# Patient Record
Sex: Male | Born: 1950 | Race: Black or African American | Hispanic: No | Marital: Single | Smoking: Never smoker
Health system: Southern US, Community
[De-identification: ages and names within clinical notes are randomized; demographics above are authoritative.]

## PROBLEM LIST (undated history)

## (undated) DIAGNOSIS — I639 Cerebral infarction, unspecified: Secondary | ICD-10-CM

## (undated) DIAGNOSIS — I2510 Atherosclerotic heart disease of native coronary artery without angina pectoris: Secondary | ICD-10-CM

## (undated) DIAGNOSIS — N189 Chronic kidney disease, unspecified: Secondary | ICD-10-CM

## (undated) DIAGNOSIS — E669 Obesity, unspecified: Secondary | ICD-10-CM

## (undated) DIAGNOSIS — F329 Major depressive disorder, single episode, unspecified: Secondary | ICD-10-CM

## (undated) DIAGNOSIS — I509 Heart failure, unspecified: Secondary | ICD-10-CM

## (undated) DIAGNOSIS — N400 Enlarged prostate without lower urinary tract symptoms: Secondary | ICD-10-CM

## (undated) DIAGNOSIS — F32A Depression, unspecified: Secondary | ICD-10-CM

## (undated) DIAGNOSIS — E785 Hyperlipidemia, unspecified: Secondary | ICD-10-CM

## (undated) DIAGNOSIS — F419 Anxiety disorder, unspecified: Secondary | ICD-10-CM

## (undated) DIAGNOSIS — M48061 Spinal stenosis, lumbar region without neurogenic claudication: Secondary | ICD-10-CM

## (undated) DIAGNOSIS — G4730 Sleep apnea, unspecified: Secondary | ICD-10-CM

## (undated) DIAGNOSIS — I10 Essential (primary) hypertension: Secondary | ICD-10-CM

## (undated) HISTORY — PX: COLONOSCOPY: SHX174

## (undated) HISTORY — PX: CARDIAC CATHETERIZATION: SHX172

## (undated) HISTORY — PX: THROMBECTOMY: PRO61

## (undated) HISTORY — PX: HEMORRHOID SURGERY: SHX153

## (undated) HISTORY — PX: CHOLECYSTECTOMY: SHX55

## (undated) HISTORY — PX: JOINT REPLACEMENT: SHX530

## (undated) HISTORY — PX: ABOVE KNEE LEG AMPUTATION: SUR20

## (undated) HISTORY — PX: SHOULDER ARTHROSCOPY: SHX128

## (undated) HISTORY — PX: AV FISTULA PLACEMENT: SHX1204

---

## 2016-08-26 ENCOUNTER — Inpatient Hospital Stay
Admission: RE | Admit: 2016-08-26 | Discharge: 2016-10-05 | Disposition: A | Discharge status: Skilled Nursing Facility | Payer: Medicare Other | Source: Other Acute Inpatient Hospital | Attending: Internal Medicine | Admitting: Internal Medicine

## 2016-08-26 ENCOUNTER — Other Ambulatory Visit (HOSPITAL_COMMUNITY): Payer: Medicare Other

## 2016-08-26 DIAGNOSIS — T8241XS Breakdown (mechanical) of vascular dialysis catheter, sequela: Secondary | ICD-10-CM

## 2016-08-26 DIAGNOSIS — W19XXXA Unspecified fall, initial encounter: Secondary | ICD-10-CM

## 2016-08-26 DIAGNOSIS — I770 Arteriovenous fistula, acquired: Secondary | ICD-10-CM

## 2016-08-26 DIAGNOSIS — Z452 Encounter for adjustment and management of vascular access device: Secondary | ICD-10-CM

## 2016-08-26 DIAGNOSIS — J189 Pneumonia, unspecified organism: Secondary | ICD-10-CM

## 2016-08-26 DIAGNOSIS — J9690 Respiratory failure, unspecified, unspecified whether with hypoxia or hypercapnia: Secondary | ICD-10-CM

## 2016-08-26 DIAGNOSIS — R7881 Bacteremia: Secondary | ICD-10-CM

## 2016-08-26 DIAGNOSIS — M549 Dorsalgia, unspecified: Secondary | ICD-10-CM

## 2016-08-26 HISTORY — DX: Heart failure, unspecified: I50.9

## 2016-08-26 HISTORY — DX: Spinal stenosis, lumbar region without neurogenic claudication: M48.061

## 2016-08-26 HISTORY — DX: Major depressive disorder, single episode, unspecified: F32.9

## 2016-08-26 HISTORY — DX: Atherosclerotic heart disease of native coronary artery without angina pectoris: I25.10

## 2016-08-26 HISTORY — DX: Depression, unspecified: F32.A

## 2016-08-26 HISTORY — DX: Sleep apnea, unspecified: G47.30

## 2016-08-26 HISTORY — DX: Essential (primary) hypertension: I10

## 2016-08-26 HISTORY — DX: Hyperlipidemia, unspecified: E78.5

## 2016-08-26 HISTORY — DX: Chronic kidney disease, unspecified: N18.9

## 2016-08-26 HISTORY — DX: Benign prostatic hyperplasia without lower urinary tract symptoms: N40.0

## 2016-08-26 HISTORY — DX: Anxiety disorder, unspecified: F41.9

## 2016-08-26 HISTORY — DX: Obesity, unspecified: E66.9

## 2016-08-26 HISTORY — DX: Cerebral infarction, unspecified: I63.9

## 2016-08-27 LAB — CBC WITH DIFFERENTIAL/PLATELET
BASOS PCT: 1 %
Basophils Absolute: 0.1 10*3/uL (ref 0.0–0.1)
Eosinophils Absolute: 0.4 10*3/uL (ref 0.0–0.7)
Eosinophils Relative: 5 %
HEMATOCRIT: 30.5 % — AB (ref 39.0–52.0)
HEMOGLOBIN: 9 g/dL — AB (ref 13.0–17.0)
LYMPHS ABS: 2.5 10*3/uL (ref 0.7–4.0)
Lymphocytes Relative: 30 %
MCH: 30.8 pg (ref 26.0–34.0)
MCHC: 29.5 g/dL — AB (ref 30.0–36.0)
MCV: 104.5 fL — ABNORMAL HIGH (ref 78.0–100.0)
MONOS PCT: 10 %
Monocytes Absolute: 0.8 10*3/uL (ref 0.1–1.0)
NEUTROS ABS: 4.4 10*3/uL (ref 1.7–7.7)
NEUTROS PCT: 54 %
Platelets: 226 10*3/uL (ref 150–400)
RBC: 2.92 MIL/uL — AB (ref 4.22–5.81)
RDW: 18.1 % — ABNORMAL HIGH (ref 11.5–15.5)
WBC: 8.2 10*3/uL (ref 4.0–10.5)

## 2016-08-27 LAB — COMPREHENSIVE METABOLIC PANEL
ALT: 11 U/L — AB (ref 17–63)
AST: 37 U/L (ref 15–41)
Albumin: 2.6 g/dL — ABNORMAL LOW (ref 3.5–5.0)
Alkaline Phosphatase: 131 U/L — ABNORMAL HIGH (ref 38–126)
Anion gap: 9 (ref 5–15)
BILIRUBIN TOTAL: 0.5 mg/dL (ref 0.3–1.2)
BUN: 17 mg/dL (ref 6–20)
CALCIUM: 8.3 mg/dL — AB (ref 8.9–10.3)
CO2: 30 mmol/L (ref 22–32)
CREATININE: 6.92 mg/dL — AB (ref 0.61–1.24)
Chloride: 97 mmol/L — ABNORMAL LOW (ref 101–111)
GFR calc Af Amer: 9 mL/min — ABNORMAL LOW (ref >60)
GFR, EST NON AFRICAN AMERICAN: 7 mL/min — AB (ref >60)
Glucose, Bld: 92 mg/dL (ref 65–99)
Potassium: 3.7 mmol/L (ref 3.5–5.1)
Sodium: 136 mmol/L (ref 135–145)
TOTAL PROTEIN: 7.4 g/dL (ref 6.5–8.1)

## 2016-08-27 LAB — LIPID PANEL
Cholesterol: 68 mg/dL (ref 0–200)
HDL: 28 mg/dL — ABNORMAL LOW (ref >40)
LDL CALC: 27 mg/dL (ref 0–99)
Total CHOL/HDL Ratio: 2.4 RATIO
Triglycerides: 63 mg/dL (ref <150)
VLDL: 13 mg/dL (ref 0–40)

## 2016-08-27 LAB — PROTIME-INR
INR: 1.16
Prothrombin Time: 14.9 seconds (ref 11.4–15.2)

## 2016-08-27 LAB — T4, FREE: Free T4: 1.06 ng/dL (ref 0.61–1.12)

## 2016-08-27 LAB — MAGNESIUM: MAGNESIUM: 2.1 mg/dL (ref 1.7–2.4)

## 2016-08-27 LAB — PHOSPHORUS: Phosphorus: 2.2 mg/dL — ABNORMAL LOW (ref 2.5–4.6)

## 2016-08-27 NOTE — Consult Note (Signed)
[facility] CONSULT NOTE    Date: 08/27/2016                  Patient Name:  Christopher Walls  MRN: [id number]  DOB: Jan 06, 1951  Age / Sex: 65 y.o., male         PCP: Vianne BullsSTUGART,RICKY H, MD                 Service Requesting Consult: Hospitalist                 Reason for Consult: Evaluation and management of ESRD            History of Present Illness: Patient is a 65 y.o. male with a PMHx of ESRD on dialysis, hypertension, coronary artery disease, previous CVA, severe peripheral vascular disease, bilateral above-the-knee amputations, who was admitted to [facility] on 08/26/2016 for ongoing management of a very severe large sacral decubitus ulcer.  The patient has complicated history.  His current illness began after he had recent right above-the-knee amputation.  Thereafter he went to a rehabilitation facility.  He then developed fever and sepsis and was transferred back to acute care facility.  He was initially treated with Zyvox and Zosyn.  Patient underwent diverting colostomy 07/26/16.  He also had debridement of his large check a decubitus ulcer on 08/09/16.  The big issue remains that the patient is unable to sit for hemodialysis given the size and severity of his underlying sacral decubitus ulcer.  His hemodialysis access recently clotted and he has a right internal jugular PermCath in place at the moment.   Medications at discharge from acute care facility:  ascorbic acid (VITAMIN C) 100 MG tablet Take one tablet (100 mg total) by mouth daily. Start date: 08/27/2016, End date: 08/27/2017   camphor-menthol [facility](SARNA) lotion Apply topically as needed for Itching. Start date: 08/26/2016, End date: 08/26/2017   clotrimazole-betamethasone (LOTRISONE) cream Apply topically every 12 (twelve) hours. Start date: 08/26/2016, End date: 08/26/2017   darbepoetin (ARANESP) 100 mcg/mL injection Inject 2 mLs (200 mcg total) into the skin every 7 (seven) days. Start date: 08/30/2016    melatonin 3 mg TABS Take one tablet (3 mg total) by mouth at bedtime. Start date: 08/26/2016   morphine sulfate (MSIR) 15 mg tablet Take one tablet (15 mg total) by mouth every 12 (twelve) hours as needed. Start date: 08/26/2016   sennosides-docusate sodium (SENOKOT-S) 8.6-50 mg per tablet Take two tablets by mouth 2 (two) times daily. Start date: 08/26/2016, End date: 08/26/2017   sodium hypochlorite (DAKIN'S) 0.025% SOLN Apply 15 mLs topically every 12 (twelve) hours. Start date: 08/26/2016    CONTINUED medications  Details  acetaminophen (TYLENOL) 325 mg tablet Take 650 mg by mouth 3 (three) times a day as needed for Pain. Takes two tablets (650mg )   aspirin 81 mg chewable tablet Chew 81 mg by mouth every morning.   B Complex-C-Folic Acid (DIALYVITE PO) Take 1 tablet by mouth every morning.   cinacalcet (SENSIPAR) 60 mg tablet TAKE 1 TABLET BY MOUTH DAILY   escitalopram oxalate (LEXAPRO) 10 mg tablet take 1 tablet by mouth once daily   lanthanum carbonate (FOSRENOL) 1000 mg chewable tablet Chew 2,000 mg by mouth 3 (three) times daily with meals. Taking 2 tabs po w/meals    polyethylene glycol (MIRALAX) packet Take 17 g by mouth daily as needed.   Respiratory Therapy Supplies DEVI CPAP at bedtime   simvastatin (ZOCOR) 20 MG tablet Take 10 mg by mouth at  bedtime. Takes one half tablet (10mg )   busPIRone (BUSPAR) 5 mg tablet take 1 and 1/2 tablets by mouth three times a day          Allergies: Allergies not on file    Past Medical/Surigcal History: . *Severe sepsis (*) 07/15/2016 Yes  . Depression, unspecified depression type 08/24/2016 Yes  . Anxiety disorder, unspecified type 08/24/2016 Yes  . Protein-calorie malnutrition, severe (*) 08/22/2016 Unknown  . Decubitus ulcer of buttock, stage 4 (*) 08/17/2016 Yes  . Lip lesion 08/16/2016 Yes  . Altered mental state 07/29/2016 Yes  . S/P colostomy (*) 07/26/2016 Not Applicable  . Delirium due to another medical  condition, acute, mixed level of activity 07/03/2016 Yes  . Fever 07/03/2016 Yes  . S/P AKA (above knee amputation) unilateral, right (*) 07/02/2016 Not Applicable  . S/P AKA (above knee amputation) unilateral, left (*) 06/18/2014 Not Applicable  . PAD (peripheral artery disease) (*) 03/04/2014 Yes  . Anemia of chronic renal failure 07/20/2013 Yes  . History of stroke 05/18/2013 Not Applicable  . Mixed hyperlipidemia 05/18/2013 Yes  . End-stage renal disease on hemodialysis (*) 04/26/2012 Not Applicable  . Essential hypertension 04/26/2012 Yes  . Obesity, Class III, BMI 40-49.9 (morbid obesity) (*) 04/26/2012 Yes  . S/P coronary artery stent placement 04/26/2012 Not Applicable  . History of coronary artery disease 07/27/2011 Not Applicable  . Obstructive sleep apnea   Family History: No family history on file.   Social History: Social History   Social History  . Marital status: Single    Spouse name: N/A  . Number of children: N/A  . Years of education: N/A   Occupational History  . Not on file.   Social History Main Topics  . Smoking status: Not on file  . Smokeless tobacco: Not on file  . Alcohol use Not on file  . Drug use: Unknown  . Sexual activity: Not on file   Other Topics Concern  . Not on file   Social History Narrative  . No narrative on file     Review of Systems: Review of Systems  Constitutional: Positive for malaise/fatigue. Negative for chills and fever.  HENT: Negative for hearing loss and tinnitus.   Eyes: Negative for blurred vision, double vision and photophobia.  Respiratory: Negative for cough, hemoptysis and sputum production.   Cardiovascular: Negative for chest pain, palpitations and orthopnea.  Gastrointestinal: Negative for heartburn, nausea and vomiting.  Genitourinary: Negative for dysuria and urgency.  Musculoskeletal: Positive for joint pain.  Skin: Negative for rash.  Neurological: Negative for dizziness, seizures and  headaches.  Endo/Heme/Allergies: Negative for polydipsia. Does not bruise/bleed easily.  Psychiatric/Behavioral: Positive for depression. The patient is not nervous/anxious.      Vital Signs: Pulse 75 respirations 21 and blood pressure 88/48.  Pulse ox 100% Physical Exam: General: NAD, laying on his left side  Head: Normocephalic, atraumatic.  Eyes: Anicteric, EOMI  Nose: Mucous membranes moist, not inflammed, nonerythematous.  Throat: Oropharynx nonerythematous, no exudate appreciated.   Neck: Supple, trachea midline.  Lungs:  Normal respiratory effort. Clear to auscultation BL without crackles or wheezes.  Heart: RRR. S1 and S2 normal without gallop, murmur, or rubs.  Abdomen:  Diverting colostomy in place  Extremities: Bilateral above the knee amputations  Neurologic: A&O X3, follows commands with both upper extremeties  Skin: No visible rashes, scars.    Lab results: Basic Metabolic Panel:  Recent Labs Lab 08/27/16 0424  NA 136  K 3.7  CL 97*  CO2  30  GLUCOSE 92  BUN 17  CREATININE 6.92*  CALCIUM 8.3*  MG 2.1  PHOS 2.2*    Liver Function Tests:  Recent Labs Lab 08/27/16 0424  AST 37  ALT 11*  ALKPHOS 131*  BILITOT 0.5  PROT 7.4  ALBUMIN 2.6*   No results for input(s): LIPASE, AMYLASE in the last 168 hours. No results for input(s): AMMONIA in the last 168 hours.  CBC:  Recent Labs Lab 08/27/16 0424  WBC 8.2  NEUTROABS 4.4  HGB 9.0*  HCT 30.5*  MCV 104.5*  PLT 226    Cardiac Enzymes: No results for input(s): CKTOTAL, CKMB, CKMBINDEX, TROPONINI in the last 168 hours.  BNP: Invalid input(s): POCBNP  CBG: No results for input(s): GLUCAP in the last 168 hours.  Microbiology: No results found for this or any previous visit.  Coagulation Studies:  Recent Labs  08/27/16 0424  LABPROT 14.9  INR 1.16    Urinalysis: No results for input(s): COLORURINE, LABSPEC, PHURINE, GLUCOSEU, HGBUR, BILIRUBINUR, KETONESUR, PROTEINUR, UROBILINOGEN,  NITRITE, LEUKOCYTESUR in the last 72 hours.  Invalid input(s): APPERANCEUR    Imaging: Dg Chest Port 1 View  Result Date: 08/26/2016 CLINICAL DATA:  Acute onset of respiratory failure. Initial encounter. EXAM: PORTABLE CHEST 1 VIEW COMPARISON:  None. FINDINGS: The lungs are well-aerated. Mild vascular congestion is noted. Mild right basilar opacity may reflect atelectasis or possibly mild pneumonia. There is no evidence of pleural effusion or pneumothorax. The cardiomediastinal silhouette is within normal limits. No acute osseous abnormalities are seen. A right-sided dual-lumen catheter is noted ending about the mid SVC. IMPRESSION: Mild vascular congestion noted. Mild right basilar opacity may reflect atelectasis or possibly mild pneumonia. Electronically Signed   By: Roanna RaiderJeffery  Chang M.D.   On: 08/26/2016 22:13      Assessment & Plan: Pt is a 65 y.o. male  with a PMHx of ESRD on dialysis, hypertension, coronary artery disease, previous CVA, severe peripheral vascular disease, bilateral above-the-knee amputations, who was admitted to [facility] on 08/26/2016 for ongoing management of a very severe large sacral decubitus ulcer.   1.  ESRD on HD:  Patient was previously on dialysis Monday, Wednesday, Friday.  We will accommodate the patient on dialysis on Tuesday, Thursday, Saturday after discussion with dialysis charge nurse.  The big issue will be how the patient will be able to sit up for dialysis.  He has an extremely large sacral decubitus ulcer.  Currently unclear as to how much to healing will occur.  Patient will need to be able to tolerate sitting up for at least 4 hours for the dialysis as well as associated transportation.  For now we will continue to perform dialysis in the bed.  The patient will have a very difficult disposition.   to.  Anemia chronic kidney disease.  Hemoglobin currently 9.0.  Patient was on Aranesp prior to his hospitalization here.  He was receiving 100 g subcutaneous  weekly.  We will resume Aranesp 100 g subcutaneous weekly while here.  3.  Secondary hyperparathyroidism.  Check intact PTH and phosphorus with next I also is treatment.  Continue Fosrenol as well as Sensipar.  4. Large sacral decubitus ulcer.  As per wound care team as well as hospitalist.  His disposition is in question as above.  5.  Hypotension:  Apparently chronic in nature, hospitalist starting midodrine today.    6.  Thanks for consultation.

## 2016-08-28 LAB — RENAL FUNCTION PANEL
ALBUMIN: 2.1 g/dL — AB (ref 3.5–5.0)
Anion gap: 8 (ref 5–15)
BUN: 26 mg/dL — AB (ref 6–20)
CALCIUM: 8 mg/dL — AB (ref 8.9–10.3)
CO2: 31 mmol/L (ref 22–32)
CREATININE: 8.81 mg/dL — AB (ref 0.61–1.24)
Chloride: 98 mmol/L — ABNORMAL LOW (ref 101–111)
GFR calc Af Amer: 6 mL/min — ABNORMAL LOW (ref >60)
GFR calc non Af Amer: 6 mL/min — ABNORMAL LOW (ref >60)
GLUCOSE: 81 mg/dL (ref 65–99)
PHOSPHORUS: 2.9 mg/dL (ref 2.5–4.6)
POTASSIUM: 4 mmol/L (ref 3.5–5.1)
SODIUM: 137 mmol/L (ref 135–145)

## 2016-08-28 LAB — CBC
HCT: 29.9 % — ABNORMAL LOW (ref 39.0–52.0)
Hemoglobin: 8.9 g/dL — ABNORMAL LOW (ref 13.0–17.0)
MCH: 31.1 pg (ref 26.0–34.0)
MCHC: 29.8 g/dL — AB (ref 30.0–36.0)
MCV: 104.5 fL — ABNORMAL HIGH (ref 78.0–100.0)
PLATELETS: 217 10*3/uL (ref 150–400)
RBC: 2.86 MIL/uL — ABNORMAL LOW (ref 4.22–5.81)
RDW: 17.9 % — AB (ref 11.5–15.5)
WBC: 9.5 10*3/uL (ref 4.0–10.5)

## 2016-08-29 LAB — HEMOGLOBIN A1C
HEMOGLOBIN A1C: 4.4 % — AB (ref 4.8–5.6)
Mean Plasma Glucose: 80 mg/dL

## 2016-08-29 LAB — HEPATITIS B SURFACE ANTIGEN: Hepatitis B Surface Ag: NEGATIVE

## 2016-08-29 LAB — HEPATITIS B SURFACE ANTIBODY,QUALITATIVE: Hep B S Ab: NONREACTIVE

## 2016-08-29 LAB — HEPATITIS B CORE ANTIBODY, IGM: Hep B C IgM: NEGATIVE

## 2016-08-30 NOTE — Progress Notes (Signed)
Subjective:   Patient's wife is at bedside. He is noted to provide much details. She states that he is eating poorly. He underwent dialysis on Saturday. Tolerated well.  Objective:  Vital signs in last 24 hours:  BP: ()/()  Arterial Line BP: ()/()   Weight change:  There were no vitals filed for this visit.  Intake/Output:   No intake or output data in the 24 hours ending 08/30/16 1610 Vital signs: Temperature 98.7, pulse 73, respirations 14, blood pressure 99/51  Physical Exam: General: No acute distress, laying in the bed, obese  HEENT Anicteric, moist mucous membranes  Neck supple  Pulm/lungs  normal breathing effort, clear to auscultation  CVS/Heart No rub or gallop  Abdomen:  Soft, nontender, nondistended, diverting colostomy  Extremities: Bilateral above-the-knee amputation, right wound healing ongoing  Neurologic: Alert, appears depressed, able to follow simple commands  Skin: Wound VAC to the sacral decubitus ulcer  Access: Right IJ PermCath       Basic Metabolic Panel:   Recent Labs Lab 08/27/16 0424 08/28/16 0745  NA 136 137  K 3.7 4.0  CL 97* 98*  CO2 30 31  GLUCOSE 92 81  BUN 17 26*  CREATININE 6.92* 8.81*  CALCIUM 8.3* 8.0*  MG 2.1  --   PHOS 2.2* 2.9     CBC:  Recent Labs Lab 08/27/16 0424 08/28/16 0745  WBC 8.2 9.5  NEUTROABS 4.4  --   HGB 9.0* 8.9*  HCT 30.5* 29.9*  MCV 104.5* 104.5*  PLT 226 217      Microbiology:  Recent Results (from the past 720 hour(s))  Culture, blood (routine x 2)     Status: None (Preliminary result)   Collection Time: 08/26/16  9:45 PM  Result Value Ref Range Status   Specimen Description BLOOD RIGHT ANTECUBITAL  Final   Special Requests BOTTLES DRAWN AEROBIC AND ANAEROBIC 5CC  Final   Culture NO GROWTH 4 DAYS  Final   Report Status PENDING  Incomplete  Culture, blood (routine x 2)     Status: None (Preliminary result)   Collection Time: 08/26/16  9:52 PM  Result Value Ref Range Status    Specimen Description BLOOD RIGHT HAND  Final   Special Requests IN PEDIATRIC BOTTLE 3CC  Final   Culture NO GROWTH 4 DAYS  Final   Report Status PENDING  Incomplete    Coagulation Studies: No results for input(s): LABPROT, INR in the last 72 hours.  Urinalysis: No results for input(s): COLORURINE, LABSPEC, PHURINE, GLUCOSEU, HGBUR, BILIRUBINUR, KETONESUR, PROTEINUR, UROBILINOGEN, NITRITE, LEUKOCYTESUR in the last 72 hours.  Invalid input(s): APPERANCEUR    Imaging: No results found.   Medications:       Assessment/ Plan:  65 y.o. male  with a PMHx of ESRD on dialysis for 9 years, hypertension, coronary artery disease, previous CVA, severe peripheral vascular disease, bilateral above-the-knee amputations, who was admitted to [facility] on 08/26/2016 for ongoing management of a very severe large sacral decubitus ulcer.   1.  ESRD on HD:  Patient was previously on dialysis Monday, Wednesday, Friday.   For now, patient was dialyzed on Tuesday, Thursday, Saturday shift. We will switch him to MWF as a slot becomes available.    The big issue will be how the patient will be able to sit up for dialysis.  He has an extremely large sacral decubitus ulcer.  Currently unclear as to how much to healing will occur.  Patient will need to be able to tolerate sitting up  for at least 4 hours for the dialysis as well as associated transportation.  For now we will continue to perform dialysis in the bed.  The patient will have a very difficult disposition.  2.  Anemia chronic kidney disease.  Hemoglobin currently 8.9.  Patient was on Aranesp prior to his hospitalization here.  He was receiving 100 g subcutaneous weekly.  We will resume Aranesp 100 g subcutaneous weekly while here.  3.  Secondary hyperparathyroidism.  Monitor phosphorus.  Continue Fosrenol as well as Sensipar.  4. Large sacral decubitus ulcer.  As per wound care team as well as hospitalist.  His disposition is in question as  above.  5.  Hypotension:   starting midodrine today.        LOS: 0 Christopher Walls 11/6/20174:10 PM

## 2016-08-31 LAB — RENAL FUNCTION PANEL
ALBUMIN: 2.2 g/dL — AB (ref 3.5–5.0)
ANION GAP: 10 (ref 5–15)
BUN: 49 mg/dL — AB (ref 6–20)
CALCIUM: 8.1 mg/dL — AB (ref 8.9–10.3)
CO2: 28 mmol/L (ref 22–32)
Chloride: 95 mmol/L — ABNORMAL LOW (ref 101–111)
Creatinine, Ser: 11.05 mg/dL — ABNORMAL HIGH (ref 0.61–1.24)
GFR calc Af Amer: 5 mL/min — ABNORMAL LOW (ref >60)
GFR calc non Af Amer: 4 mL/min — ABNORMAL LOW (ref >60)
GLUCOSE: 96 mg/dL (ref 65–99)
PHOSPHORUS: 3.2 mg/dL (ref 2.5–4.6)
Potassium: 4.1 mmol/L (ref 3.5–5.1)
SODIUM: 133 mmol/L — AB (ref 135–145)

## 2016-08-31 LAB — CBC
HCT: 29.2 % — ABNORMAL LOW (ref 39.0–52.0)
HEMOGLOBIN: 9 g/dL — AB (ref 13.0–17.0)
MCH: 31.5 pg (ref 26.0–34.0)
MCHC: 30.8 g/dL (ref 30.0–36.0)
MCV: 102.1 fL — ABNORMAL HIGH (ref 78.0–100.0)
Platelets: 235 10*3/uL (ref 150–400)
RBC: 2.86 MIL/uL — ABNORMAL LOW (ref 4.22–5.81)
RDW: 17.5 % — AB (ref 11.5–15.5)
WBC: 11.2 10*3/uL — ABNORMAL HIGH (ref 4.0–10.5)

## 2016-08-31 LAB — CULTURE, BLOOD (ROUTINE X 2)
CULTURE: NO GROWTH
Culture: NO GROWTH

## 2016-09-01 NOTE — Progress Notes (Signed)
Subjective:   Patient's wife is at bedside. He is doing fair. Underwent wound vac change therefore pain at the moment. Tolerated dialysis well on Tuesday  Objective:  Vital signs in last 24 hours:  BP: ()/()  Arterial Line BP: ()/()   Weight change:  There were no vitals filed for this visit.  Intake/Output:   No intake or output data in the 24 hours ending 09/01/16 1612 Vital signs: Temperature 97.9, pulse 65, respirations 25, blood pressure 120/50  Physical Exam: General: No acute distress, laying in the bed, obese  HEENT Anicteric, moist mucous membranes  Neck supple  Pulm/lungs  normal breathing effort, clear to auscultation  CVS/Heart No rub or gallop  Abdomen:  Soft, nontender, nondistended, diverting colostomy  Extremities: Bilateral above-the-knee amputation, right wound healing ongoing  Neurologic: Alert, appears depressed, able to follow simple commands  Skin: Wound VAC to the sacral decubitus ulcer  Access: Right IJ PermCath       Basic Metabolic Panel:   Recent Labs Lab 08/27/16 0424 08/28/16 0745 08/31/16 0749  NA 136 137 133*  K 3.7 4.0 4.1  CL 97* 98* 95*  CO2 30 31 28   GLUCOSE 92 81 96  BUN 17 26* 49*  CREATININE 6.92* 8.81* 11.05*  CALCIUM 8.3* 8.0* 8.1*  MG 2.1  --   --   PHOS 2.2* 2.9 3.2     CBC:  Recent Labs Lab 08/27/16 0424 08/28/16 0745 08/31/16 0749  WBC 8.2 9.5 11.2*  NEUTROABS 4.4  --   --   HGB 9.0* 8.9* 9.0*  HCT 30.5* 29.9* 29.2*  MCV 104.5* 104.5* 102.1*  PLT 226 217 235      Microbiology:  Recent Results (from the past 720 hour(s))  Culture, blood (routine x 2)     Status: None   Collection Time: 08/26/16  9:45 PM  Result Value Ref Range Status   Specimen Description BLOOD RIGHT ANTECUBITAL  Final   Special Requests BOTTLES DRAWN AEROBIC AND ANAEROBIC 5CC  Final   Culture NO GROWTH 5 DAYS  Final   Report Status 08/31/2016 FINAL  Final  Culture, blood (routine x 2)     Status: None   Collection Time:  08/26/16  9:52 PM  Result Value Ref Range Status   Specimen Description BLOOD RIGHT HAND  Final   Special Requests IN PEDIATRIC BOTTLE 3CC  Final   Culture NO GROWTH 5 DAYS  Final   Report Status 08/31/2016 FINAL  Final    Coagulation Studies: No results for input(s): LABPROT, INR in the last 72 hours.  Urinalysis: No results for input(s): COLORURINE, LABSPEC, PHURINE, GLUCOSEU, HGBUR, BILIRUBINUR, KETONESUR, PROTEINUR, UROBILINOGEN, NITRITE, LEUKOCYTESUR in the last 72 hours.  Invalid input(s): APPERANCEUR    Imaging: No results found.   Medications:       Assessment/ Plan:  65 y.o. male  with a PMHx of ESRD on dialysis for 9 years, hypertension, coronary artery disease, previous CVA, severe peripheral vascular disease, bilateral above-the-knee amputations, who was admitted to [facility] on 08/26/2016 for ongoing management of a very severe large sacral decubitus ulcer.   1.  ESRD on HD:  Patient was previously on dialysis Monday, Wednesday, Friday.   Patient prefers to stay on TTS shift due to wound vac change schedules etc Next HD on Thursday   The big issue will be how the patient will be able to sit up for dialysis.  He has an extremely large sacral decubitus ulcer.  Currently unclear as to how much  to healing will occur.  Patient will need to be able to tolerate sitting up for at least 4 hours for the dialysis as well as associated transportation.  For now we will continue to perform dialysis in the bed.  The patient will have a very difficult disposition.  2.  Anemia chronic kidney disease.  Hemoglobin currently 9.0.  Patient was on Aranesp prior to his hospitalization here.  He was receiving 100 g subcutaneous weekly.  We will resume Aranesp 100 g subcutaneous weekly while here.  3.  Secondary hyperparathyroidism.  Monitor phosphorus.  Continue Fosrenol as well as Sensipar. Currently 3.2  4. Large sacral decubitus ulcer.  As per wound care team as well as  hospitalist.  His disposition is in question as above.  5.  Hypotension:  continue midodrine       LOS: 0 Kiala Faraj 11/8/20174:12 PM

## 2016-09-02 ENCOUNTER — Other Ambulatory Visit (HOSPITAL_COMMUNITY): Payer: Medicare Other

## 2016-09-02 LAB — RENAL FUNCTION PANEL
ALBUMIN: 2.8 g/dL — AB (ref 3.5–5.0)
Anion gap: 12 (ref 5–15)
BUN: 46 mg/dL — AB (ref 6–20)
CO2: 24 mmol/L (ref 22–32)
CREATININE: 11.38 mg/dL — AB (ref 0.61–1.24)
Calcium: 8.5 mg/dL — ABNORMAL LOW (ref 8.9–10.3)
Chloride: 96 mmol/L — ABNORMAL LOW (ref 101–111)
GFR calc Af Amer: 5 mL/min — ABNORMAL LOW (ref >60)
GFR calc non Af Amer: 4 mL/min — ABNORMAL LOW (ref >60)
GLUCOSE: 89 mg/dL (ref 65–99)
PHOSPHORUS: 3.9 mg/dL (ref 2.5–4.6)
POTASSIUM: 4.6 mmol/L (ref 3.5–5.1)
SODIUM: 132 mmol/L — AB (ref 135–145)

## 2016-09-02 LAB — CBC
HEMATOCRIT: 37.1 % — AB (ref 39.0–52.0)
Hemoglobin: 11.3 g/dL — ABNORMAL LOW (ref 13.0–17.0)
MCH: 31.3 pg (ref 26.0–34.0)
MCHC: 30.5 g/dL (ref 30.0–36.0)
MCV: 102.8 fL — ABNORMAL HIGH (ref 78.0–100.0)
PLATELETS: 278 10*3/uL (ref 150–400)
RBC: 3.61 MIL/uL — ABNORMAL LOW (ref 4.22–5.81)
RDW: 17.2 % — AB (ref 11.5–15.5)
WBC: 13.1 10*3/uL — ABNORMAL HIGH (ref 4.0–10.5)

## 2016-09-03 NOTE — Progress Notes (Signed)
Subjective:   Patient's wife is at bedside. He is doing fair. Underwent wound vac change therefore pain at the moment. Tolerated dialysis well on Thursday He fell down off of the bed after dialysis Currently no SOB  Objective:  Vital signs in last 24 hours:  Temperature 97.4, pulse 85, respirations 20, blood pressure 127/67  Physical Exam: General: No acute distress, laying in the bed, obese  HEENT Anicteric, moist mucous membranes  Neck supple  Pulm/lungs  normal breathing effort, clear to auscultation  CVS/Heart No rub or gallop  Abdomen:  Soft, nontender, nondistended, diverting colostomy  Extremities: Bilateral above-the-knee amputation, right wound healing ongoing  Neurologic: Alert, appears depressed, able to follow simple commands  Skin: Wound VAC to the sacral decubitus ulcer  Access: Right IJ PermCath       Basic Metabolic Panel:   Recent Labs Lab 08/28/16 0745 08/31/16 0749 09/02/16 0547  NA 137 133* 132*  K 4.0 4.1 4.6  CL 98* 95* 96*  CO2 31 28 24   GLUCOSE 81 96 89  BUN 26* 49* 46*  CREATININE 8.81* 11.05* 11.38*  CALCIUM 8.0* 8.1* 8.5*  PHOS 2.9 3.2 3.9     CBC:  Recent Labs Lab 08/28/16 0745 08/31/16 0749 09/02/16 0547  WBC 9.5 11.2* 13.1*  HGB 8.9* 9.0* 11.3*  HCT 29.9* 29.2* 37.1*  MCV 104.5* 102.1* 102.8*  PLT 217 235 278      Microbiology:  Recent Results (from the past 720 hour(s))  Culture, blood (routine x 2)     Status: None   Collection Time: 08/26/16  9:45 PM  Result Value Ref Range Status   Specimen Description BLOOD RIGHT ANTECUBITAL  Final   Special Requests BOTTLES DRAWN AEROBIC AND ANAEROBIC 5CC  Final   Culture NO GROWTH 5 DAYS  Final   Report Status 08/31/2016 FINAL  Final  Culture, blood (routine x 2)     Status: None   Collection Time: 08/26/16  9:52 PM  Result Value Ref Range Status   Specimen Description BLOOD RIGHT HAND  Final   Special Requests IN PEDIATRIC BOTTLE 3CC  Final   Culture NO GROWTH 5 DAYS   Final   Report Status 08/31/2016 FINAL  Final    Coagulation Studies: No results for input(s): LABPROT, INR in the last 72 hours.  Urinalysis: No results for input(s): COLORURINE, LABSPEC, PHURINE, GLUCOSEU, HGBUR, BILIRUBINUR, KETONESUR, PROTEINUR, UROBILINOGEN, NITRITE, LEUKOCYTESUR in the last 72 hours.  Invalid input(s): APPERANCEUR    Imaging: Ct Head Wo Contrast  Result Date: 09/02/2016 CLINICAL DATA:  Status post fall EXAM: CT HEAD WITHOUT CONTRAST TECHNIQUE: Contiguous axial images were obtained from the base of the skull through the vertex without intravenous contrast. COMPARISON:  None. FINDINGS: Brain: No mass lesion, intraparenchymal hemorrhage or extra-axial collection. No evidence of acute cortical infarct. Brain parenchyma and CSF-containing spaces are normal for age. Vascular: There is atherosclerotic calcification of the vertebral and internal carotid arteries at the skullbase. Skull: Normal visualized skull base, calvarium and extracranial soft tissues. Sinuses/Orbits: No sinus fluid levels or advanced mucosal thickening. No mastoid effusion. Normal orbits. IMPRESSION: No acute intracranial abnormality. Electronically Signed   By: Deatra RobinsonKevin  Herman M.D.   On: 09/02/2016 22:34     Medications:       Assessment/ Plan:  65 y.o. male  with a PMHx of ESRD on dialysis for 9 years, hypertension, coronary artery disease, previous CVA, severe peripheral vascular disease, bilateral above-the-knee amputations, who was admitted to [facility] on 08/26/2016 for ongoing management  of a very severe large sacral decubitus ulcer.   1.  ESRD on HD:  Patient was previously on dialysis Monday, Wednesday, Friday.   Patient prefers to stay on TTS shift due to wound vac change schedules etc Next HD on saturday   The big issue will be how the patient will be able to sit up for dialysis.  He has an extremely large sacral decubitus ulcer.  Currently unclear as to how much to healing will occur.   Patient will need to be able to tolerate sitting up for at least 4 hours for the dialysis as well as associated transportation.  For now we will continue to perform dialysis in the bed.  The patient will have a very difficult disposition.  2.  Anemia chronic kidney disease.  Hemoglobin currently 11.3.  Patient was on Aranesp prior to his hospitalization here.  He was receiving 100 g subcutaneous weekly.    Hgb increased. Will hold Aranesp  3.  Secondary hyperparathyroidism.  Monitor phosphorus.  Continue Fosrenol as well as Sensipar. Currently 39  4. Large sacral decubitus ulcer.  As per wound care team as well as hospitalist.  His disposition is in question as above.  5.  Hypotension:  continue midodrine       LOS: 0 Shiree Altemus 11/10/20176:56 PM

## 2016-09-04 LAB — BASIC METABOLIC PANEL
ANION GAP: 11 (ref 5–15)
BUN: 35 mg/dL — ABNORMAL HIGH (ref 6–20)
CALCIUM: 8.3 mg/dL — AB (ref 8.9–10.3)
CO2: 25 mmol/L (ref 22–32)
Chloride: 98 mmol/L — ABNORMAL LOW (ref 101–111)
Creatinine, Ser: 8.9 mg/dL — ABNORMAL HIGH (ref 0.61–1.24)
GFR calc Af Amer: 6 mL/min — ABNORMAL LOW (ref >60)
GFR calc non Af Amer: 5 mL/min — ABNORMAL LOW (ref >60)
GLUCOSE: 89 mg/dL (ref 65–99)
POTASSIUM: 4.8 mmol/L (ref 3.5–5.1)
Sodium: 134 mmol/L — ABNORMAL LOW (ref 135–145)

## 2016-09-04 LAB — CBC
HEMATOCRIT: 30.7 % — AB (ref 39.0–52.0)
Hemoglobin: 9.3 g/dL — ABNORMAL LOW (ref 13.0–17.0)
MCH: 31 pg (ref 26.0–34.0)
MCHC: 30.3 g/dL (ref 30.0–36.0)
MCV: 102.3 fL — AB (ref 78.0–100.0)
Platelets: 294 10*3/uL (ref 150–400)
RBC: 3 MIL/uL — ABNORMAL LOW (ref 4.22–5.81)
RDW: 16.9 % — AB (ref 11.5–15.5)
WBC: 16.3 10*3/uL — AB (ref 4.0–10.5)

## 2016-09-05 ENCOUNTER — Other Ambulatory Visit (HOSPITAL_COMMUNITY): Payer: Medicare Other

## 2016-09-05 LAB — BLOOD CULTURE ID PANEL (REFLEXED)
Acinetobacter baumannii: NOT DETECTED
CANDIDA KRUSEI: NOT DETECTED
CANDIDA PARAPSILOSIS: NOT DETECTED
CANDIDA TROPICALIS: NOT DETECTED
CARBAPENEM RESISTANCE: NOT DETECTED
Candida albicans: NOT DETECTED
Candida glabrata: NOT DETECTED
ENTEROCOCCUS SPECIES: NOT DETECTED
Enterobacter cloacae complex: NOT DETECTED
Enterobacteriaceae species: NOT DETECTED
Escherichia coli: NOT DETECTED
Haemophilus influenzae: NOT DETECTED
KLEBSIELLA OXYTOCA: NOT DETECTED
KLEBSIELLA PNEUMONIAE: NOT DETECTED
LISTERIA MONOCYTOGENES: NOT DETECTED
Methicillin resistance: DETECTED — AB
Neisseria meningitidis: NOT DETECTED
PROTEUS SPECIES: NOT DETECTED
PSEUDOMONAS AERUGINOSA: NOT DETECTED
SERRATIA MARCESCENS: NOT DETECTED
STAPHYLOCOCCUS SPECIES: DETECTED — AB
STREPTOCOCCUS AGALACTIAE: NOT DETECTED
Staphylococcus aureus (BCID): DETECTED — AB
Streptococcus pneumoniae: NOT DETECTED
Streptococcus pyogenes: NOT DETECTED
Streptococcus species: NOT DETECTED
VANCOMYCIN RESISTANCE: NOT DETECTED

## 2016-09-05 LAB — CBC WITH DIFFERENTIAL/PLATELET
BASOS PCT: 0 %
Basophils Absolute: 0 10*3/uL (ref 0.0–0.1)
EOS ABS: 0 10*3/uL (ref 0.0–0.7)
EOS PCT: 0 %
HCT: 32.3 % — ABNORMAL LOW (ref 39.0–52.0)
Hemoglobin: 9.7 g/dL — ABNORMAL LOW (ref 13.0–17.0)
LYMPHS ABS: 2.2 10*3/uL (ref 0.7–4.0)
Lymphocytes Relative: 8 %
MCH: 30.6 pg (ref 26.0–34.0)
MCHC: 30 g/dL (ref 30.0–36.0)
MCV: 101.9 fL — AB (ref 78.0–100.0)
MONO ABS: 2.8 10*3/uL — AB (ref 0.1–1.0)
Monocytes Relative: 10 %
Neutro Abs: 23.1 10*3/uL — ABNORMAL HIGH (ref 1.7–7.7)
Neutrophils Relative %: 82 %
PLATELETS: 262 10*3/uL (ref 150–400)
RBC: 3.17 MIL/uL — ABNORMAL LOW (ref 4.22–5.81)
RDW: 16.9 % — AB (ref 11.5–15.5)
WBC: 28.1 10*3/uL — ABNORMAL HIGH (ref 4.0–10.5)

## 2016-09-06 NOTE — Progress Notes (Signed)
Subjective:  Patient seen at bedside. He had hemodialysis on Tuesday. Still has a very large sacral decubitus ulcer.   Objective:  Vital signs in last 24 hours:  Temperature 98 pulse 82 respirations 20 blood pressure 133/60  Physical Exam: General: No acute distress, laying in the bed, obese  HEENT Anicteric, moist mucous membranes  Neck supple  Pulm/lungs  normal breathing effort, clear to auscultation  CVS/Heart No rub or gallop  Abdomen:  Soft, nontender, nondistended, diverting colostomy  Extremities: Bilateral above-the-knee amputation  Neurologic: Alert, able to follow simple commands  Skin: Wound VAC to the sacral decubitus ulcer  Access: Right IJ PermCath       Basic Metabolic Panel:   Recent Labs Lab 08/31/16 0749 09/02/16 0547 09/04/16 0620  NA 133* 132* 134*  K 4.1 4.6 4.8  CL 95* 96* 98*  CO2 28 24 25   GLUCOSE 96 89 89  BUN 49* 46* 35*  CREATININE 11.05* 11.38* 8.90*  CALCIUM 8.1* 8.5* 8.3*  PHOS 3.2 3.9  --      CBC:  Recent Labs Lab 08/31/16 0749 09/02/16 0547 09/04/16 0620 09/05/16 0617  WBC 11.2* 13.1* 16.3* 28.1*  NEUTROABS  --   --   --  23.1*  HGB 9.0* 11.3* 9.3* 9.7*  HCT 29.2* 37.1* 30.7* 32.3*  MCV 102.1* 102.8* 102.3* 101.9*  PLT 235 278 294 262      Microbiology:  Recent Results (from the past 720 hour(s))  Culture, blood (routine x 2)     Status: None   Collection Time: 08/26/16  9:45 PM  Result Value Ref Range Status   Specimen Description BLOOD RIGHT ANTECUBITAL  Final   Special Requests BOTTLES DRAWN AEROBIC AND ANAEROBIC 5CC  Final   Culture NO GROWTH 5 DAYS  Final   Report Status 08/31/2016 FINAL  Final  Culture, blood (routine x 2)     Status: None   Collection Time: 08/26/16  9:52 PM  Result Value Ref Range Status   Specimen Description BLOOD RIGHT HAND  Final   Special Requests IN PEDIATRIC BOTTLE 3CC  Final   Culture NO GROWTH 5 DAYS  Final   Report Status 08/31/2016 FINAL  Final  Culture, blood  (routine x 2)     Status: Abnormal (Preliminary result)   Collection Time: 09/04/16  2:15 PM  Result Value Ref Range Status   Specimen Description BLOOD RIGHT HAND  Final   Special Requests IN PEDIATRIC BOTTLE 5CC  Final   Culture  Setup Time   Final    IN PEDIATRIC BOTTLE GRAM POSITIVE COCCI IN CLUSTERS CRITICAL RESULT CALLED TO, READ BACK BY AND VERIFIED WITH: TO CMASEKO(RN) BY TCLEVELAND 09/05/2016 AT 6:28AM    Culture STAPHYLOCOCCUS AUREUS (A)  Final   Report Status PENDING  Incomplete  Culture, blood (routine x 2)     Status: Abnormal (Preliminary result)   Collection Time: 09/04/16  2:25 PM  Result Value Ref Range Status   Specimen Description BLOOD RIGHT HAND  Final   Special Requests IN PEDIATRIC BOTTLE 2CC  Final   Culture  Setup Time   Final    GRAM POSITIVE COCCI IN CLUSTERS IN PEDIATRIC BOTTLE CRITICAL RESULT CALLED TO, READ BACK BY AND VERIFIED WITH: TO CMASEKO(RN) BY TCLEVELAND 09/05/2016 AT 6:28AM    Culture STAPHYLOCOCCUS AUREUS (A)  Final   Report Status PENDING  Incomplete  Blood Culture ID Panel (Reflexed)     Status: Abnormal   Collection Time: 09/04/16  2:25 PM  Result Value  Ref Range Status   Enterococcus species NOT DETECTED NOT DETECTED Final   Vancomycin resistance NOT DETECTED NOT DETECTED Final   Listeria monocytogenes NOT DETECTED NOT DETECTED Final   Staphylococcus species DETECTED (A) NOT DETECTED Final    Comment: CRITICAL RESULT CALLED TO, READ BACK BY AND VERIFIED WITH: TO CMASEKO(RN) BY TCLEVELAND 09/05/2016 AT 6:28AM    Staphylococcus aureus DETECTED (A) NOT DETECTED Final    Comment: CRITICAL RESULT CALLED TO, READ BACK BY AND VERIFIED WITH: TO CMASEKO(RN) BY TCLEVELAND 09/05/2016 AT 6:28AM    Methicillin resistance DETECTED (A) NOT DETECTED Final    Comment: CRITICAL RESULT CALLED TO, READ BACK BY AND VERIFIED WITH: TO CMASEKO(RN) BY TCLEVELAND 09/05/2016 AT 6:28AM    Streptococcus species NOT DETECTED NOT DETECTED Final   Streptococcus  agalactiae NOT DETECTED NOT DETECTED Final   Streptococcus pneumoniae NOT DETECTED NOT DETECTED Final   Streptococcus pyogenes NOT DETECTED NOT DETECTED Final   Acinetobacter baumannii NOT DETECTED NOT DETECTED Final   Enterobacteriaceae species NOT DETECTED NOT DETECTED Final   Enterobacter cloacae complex NOT DETECTED NOT DETECTED Final   Escherichia coli NOT DETECTED NOT DETECTED Final   Klebsiella oxytoca NOT DETECTED NOT DETECTED Final   Klebsiella pneumoniae NOT DETECTED NOT DETECTED Final   Proteus species NOT DETECTED NOT DETECTED Final   Serratia marcescens NOT DETECTED NOT DETECTED Final   Carbapenem resistance NOT DETECTED NOT DETECTED Final   Haemophilus influenzae NOT DETECTED NOT DETECTED Final   Neisseria meningitidis NOT DETECTED NOT DETECTED Final   Pseudomonas aeruginosa NOT DETECTED NOT DETECTED Final   Candida albicans NOT DETECTED NOT DETECTED Final   Candida glabrata NOT DETECTED NOT DETECTED Final   Candida krusei NOT DETECTED NOT DETECTED Final   Candida parapsilosis NOT DETECTED NOT DETECTED Final   Candida tropicalis NOT DETECTED NOT DETECTED Final    Coagulation Studies: No results for input(s): LABPROT, INR in the last 72 hours.  Urinalysis: No results for input(s): COLORURINE, LABSPEC, PHURINE, GLUCOSEU, HGBUR, BILIRUBINUR, KETONESUR, PROTEINUR, UROBILINOGEN, NITRITE, LEUKOCYTESUR in the last 72 hours.  Invalid input(s): APPERANCEUR    Imaging: Dg Chest Port 1 View  Result Date: 09/05/2016 CLINICAL DATA:  Patient with history of shortness of breath. EXAM: PORTABLE CHEST 1 VIEW COMPARISON:  Chest radiograph 08/26/2016. FINDINGS: Central venous catheter tip projects over the superior vena cava. Monitoring leads overlie the patient. Low lung volumes. Patient is rotated. Enlarged cardiac and mediastinal contours with tortuosity of the thoracic aorta. Bibasilar heterogeneous opacities. No pleural effusion or pneumothorax. IMPRESSION: Apparent enlargement of  the cardiac and mediastinal contours likely secondary to low lung volumes, portable technique and patient rotation. Consider correlation with PA and lateral chest radiograph. Bibasilar atelectasis. Electronically Signed   By: Annia Beltrew  Davis M.D.   On: 09/05/2016 09:05     Medications:       Assessment/ Plan:  65 y.o. male  with a PMHx of ESRD on dialysis for 9 years, hypertension, coronary artery disease, previous CVA, severe peripheral vascular disease, bilateral above-the-knee amputations, who was admitted to [facility] on 08/26/2016 for ongoing management of a very severe large sacral decubitus ulcer.   1.  ESRD on HD:  patient due for dialysis again tomorrow. We will prepare her orders for this. Continue dialysis on TTS schedule.   2.  Anemia chronic kidney disease.  hemoglobin currently 9.7. Continue to monitor CBC periodically.  3.  Secondary hyperparathyroidism.  Phosphorus 3.9. Continue Fosrenol as well as Sensipar.  4. Large sacral decubitus ulcer.  As per  wound care team as well as hospitalist.  His disposition is in question as above.  5.  Hypotension:  continue midodrine       LOS: 0 Papa [place] 11/13/20174:06 PM

## 2016-09-07 LAB — RENAL FUNCTION PANEL
ANION GAP: 9 (ref 5–15)
Albumin: 2 g/dL — ABNORMAL LOW (ref 3.5–5.0)
BUN: 56 mg/dL — ABNORMAL HIGH (ref 6–20)
CHLORIDE: 102 mmol/L (ref 101–111)
CO2: 25 mmol/L (ref 22–32)
Calcium: 8.2 mg/dL — ABNORMAL LOW (ref 8.9–10.3)
Creatinine, Ser: 10.44 mg/dL — ABNORMAL HIGH (ref 0.61–1.24)
GFR, EST AFRICAN AMERICAN: 5 mL/min — AB (ref >60)
GFR, EST NON AFRICAN AMERICAN: 5 mL/min — AB (ref >60)
Glucose, Bld: 103 mg/dL — ABNORMAL HIGH (ref 65–99)
PHOSPHORUS: 2.4 mg/dL — AB (ref 2.5–4.6)
POTASSIUM: 5.3 mmol/L — AB (ref 3.5–5.1)
Sodium: 136 mmol/L (ref 135–145)

## 2016-09-07 LAB — CBC
HEMATOCRIT: 27.5 % — AB (ref 39.0–52.0)
HEMOGLOBIN: 8.3 g/dL — AB (ref 13.0–17.0)
MCH: 30.4 pg (ref 26.0–34.0)
MCHC: 30.2 g/dL (ref 30.0–36.0)
MCV: 100.7 fL — AB (ref 78.0–100.0)
Platelets: 278 10*3/uL (ref 150–400)
RBC: 2.73 MIL/uL — AB (ref 4.22–5.81)
RDW: 17.2 % — AB (ref 11.5–15.5)
WBC: 15.1 10*3/uL — AB (ref 4.0–10.5)

## 2016-09-07 LAB — CULTURE, BLOOD (ROUTINE X 2)

## 2016-09-08 ENCOUNTER — Other Ambulatory Visit (HOSPITAL_COMMUNITY): Payer: Medicare Other

## 2016-09-08 ENCOUNTER — Encounter (HOSPITAL_COMMUNITY): Payer: Self-pay | Admitting: Interventional Radiology

## 2016-09-08 HISTORY — PX: IR GENERIC HISTORICAL: IMG1180011

## 2016-09-08 MED ORDER — HEPARIN SODIUM (PORCINE) 1000 UNIT/ML IJ SOLN
INTRAMUSCULAR | Status: AC
  Filled 2016-09-08: qty 1

## 2016-09-08 MED ORDER — LIDOCAINE HCL 1 % IJ SOLN
INTRAMUSCULAR | Status: AC
  Administered 2016-09-08: 10 mL
  Filled 2016-09-08: qty 20

## 2016-09-08 MED ORDER — CHLORHEXIDINE GLUCONATE 4 % EX LIQD
CUTANEOUS | Status: AC
  Filled 2016-09-08: qty 15

## 2016-09-08 NOTE — Procedures (Signed)
Successful fluoroscopic guided exchange of existing right jugular approach dialysis catheter.   Catheter is ready for immediate use.   EBL: None No immediate post procedural complications.   Jay Ajahni Nay, MD Pager #: [phone]  

## 2016-09-08 NOTE — Progress Notes (Addendum)
Subjective:  Blood cultures from 11/14-17 show gram-positive cocci in clusters. PermCath was exchanged over a wire today. Patient had hemodialysis yesterday but only completed one hour.   Objective:  Vital signs in last 24 hours:  Temperature 98.5 pulse 71 respirations 16 blood pressure 136/79  Physical Exam: General: No acute distress, laying in the bed, obese  HEENT Anicteric, moist mucous membranes  Neck supple  Pulm/lungs normal breathing effort, clear to auscultation  CVS/Heart No rub or gallop, S1S2  Abdomen:  Soft, nontender, nondistended, diverting colostomy  Extremities: Bilateral above-the-knee amputation  Neurologic: Alert, able to follow simple commands, depressed  Skin: Wound VAC to the sacral decubitus ulcer  Access: Right IJ PermCath       Basic Metabolic Panel:   Recent Labs Lab 09/02/16 0547 09/04/16 0620 09/07/16 0604  NA 132* 134* 136  K 4.6 4.8 5.3*  CL 96* 98* 102  CO2 24 25 25   GLUCOSE 89 89 103*  BUN 46* 35* 56*  CREATININE 11.38* 8.90* 10.44*  CALCIUM 8.5* 8.3* 8.2*  PHOS 3.9  --  2.4*     CBC:  Recent Labs Lab 09/02/16 0547 09/04/16 0620 09/05/16 0617 09/07/16 0604  WBC 13.1* 16.3* 28.1* 15.1*  NEUTROABS  --   --  23.1*  --   HGB 11.3* 9.3* 9.7* 8.3*  HCT 37.1* 30.7* 32.3* 27.5*  MCV 102.8* 102.3* 101.9* 100.7*  PLT 278 294 262 278      Microbiology:  Recent Results (from the past 720 hour(s))  Culture, blood (routine x 2)     Status: None   Collection Time: 08/26/16  9:45 PM  Result Value Ref Range Status   Specimen Description BLOOD RIGHT ANTECUBITAL  Final   Special Requests BOTTLES DRAWN AEROBIC AND ANAEROBIC 5CC  Final   Culture NO GROWTH 5 DAYS  Final   Report Status 08/31/2016 FINAL  Final  Culture, blood (routine x 2)     Status: None   Collection Time: 08/26/16  9:52 PM  Result Value Ref Range Status   Specimen Description BLOOD RIGHT HAND  Final   Special Requests IN PEDIATRIC BOTTLE 3CC  Final   Culture  NO GROWTH 5 DAYS  Final   Report Status 08/31/2016 FINAL  Final  Culture, blood (routine x 2)     Status: Abnormal   Collection Time: 09/04/16  2:15 PM  Result Value Ref Range Status   Specimen Description BLOOD RIGHT HAND  Final   Special Requests IN PEDIATRIC BOTTLE 5CC  Final   Culture  Setup Time   Final    IN PEDIATRIC BOTTLE GRAM POSITIVE COCCI IN CLUSTERS CRITICAL RESULT CALLED TO, READ BACK BY AND VERIFIED WITH: TO CMASEKO(RN) BY TCLEVELAND 09/05/2016 AT 6:28AM    Culture (A)  Final    STAPHYLOCOCCUS AUREUS SUSCEPTIBILITIES PERFORMED ON PREVIOUS CULTURE WITHIN THE LAST 5 DAYS.    Report Status 09/07/2016 FINAL  Final  Culture, blood (routine x 2)     Status: Abnormal   Collection Time: 09/04/16  2:25 PM  Result Value Ref Range Status   Specimen Description BLOOD RIGHT HAND  Final   Special Requests IN PEDIATRIC BOTTLE 2CC  Final   Culture  Setup Time   Final    GRAM POSITIVE COCCI IN CLUSTERS IN PEDIATRIC BOTTLE CRITICAL RESULT CALLED TO, READ BACK BY AND VERIFIED WITH: TO CMASEKO(RN) BY TCLEVELAND 09/05/2016 AT 6:28AM    Culture METHICILLIN RESISTANT STAPHYLOCOCCUS AUREUS (A)  Final   Report Status 09/07/2016 FINAL  Final  Organism ID, Bacteria METHICILLIN RESISTANT STAPHYLOCOCCUS AUREUS  Final      Susceptibility   Methicillin resistant staphylococcus aureus - MIC*    CIPROFLOXACIN >=8 RESISTANT Resistant     ERYTHROMYCIN <=0.25 SENSITIVE Sensitive     GENTAMICIN <=0.5 SENSITIVE Sensitive     OXACILLIN >=4 RESISTANT Resistant     TETRACYCLINE <=1 SENSITIVE Sensitive     VANCOMYCIN 1 SENSITIVE Sensitive     TRIMETH/SULFA <=10 SENSITIVE Sensitive     CLINDAMYCIN <=0.25 SENSITIVE Sensitive     RIFAMPIN <=0.5 SENSITIVE Sensitive     Inducible Clindamycin NEGATIVE Sensitive     * METHICILLIN RESISTANT STAPHYLOCOCCUS AUREUS  Blood Culture ID Panel (Reflexed)     Status: Abnormal   Collection Time: 09/04/16  2:25 PM  Result Value Ref Range Status   Enterococcus species  NOT DETECTED NOT DETECTED Final   Vancomycin resistance NOT DETECTED NOT DETECTED Final   Listeria monocytogenes NOT DETECTED NOT DETECTED Final   Staphylococcus species DETECTED (A) NOT DETECTED Final    Comment: CRITICAL RESULT CALLED TO, READ BACK BY AND VERIFIED WITH: TO CMASEKO(RN) BY TCLEVELAND 09/05/2016 AT 6:28AM    Staphylococcus aureus DETECTED (A) NOT DETECTED Final    Comment: CRITICAL RESULT CALLED TO, READ BACK BY AND VERIFIED WITH: TO CMASEKO(RN) BY TCLEVELAND 09/05/2016 AT 6:28AM    Methicillin resistance DETECTED (A) NOT DETECTED Final    Comment: CRITICAL RESULT CALLED TO, READ BACK BY AND VERIFIED WITH: TO CMASEKO(RN) BY TCLEVELAND 09/05/2016 AT 6:28AM    Streptococcus species NOT DETECTED NOT DETECTED Final   Streptococcus agalactiae NOT DETECTED NOT DETECTED Final   Streptococcus pneumoniae NOT DETECTED NOT DETECTED Final   Streptococcus pyogenes NOT DETECTED NOT DETECTED Final   Acinetobacter baumannii NOT DETECTED NOT DETECTED Final   Enterobacteriaceae species NOT DETECTED NOT DETECTED Final   Enterobacter cloacae complex NOT DETECTED NOT DETECTED Final   Escherichia coli NOT DETECTED NOT DETECTED Final   Klebsiella oxytoca NOT DETECTED NOT DETECTED Final   Klebsiella pneumoniae NOT DETECTED NOT DETECTED Final   Proteus species NOT DETECTED NOT DETECTED Final   Serratia marcescens NOT DETECTED NOT DETECTED Final   Carbapenem resistance NOT DETECTED NOT DETECTED Final   Haemophilus influenzae NOT DETECTED NOT DETECTED Final   Neisseria meningitidis NOT DETECTED NOT DETECTED Final   Pseudomonas aeruginosa NOT DETECTED NOT DETECTED Final   Candida albicans NOT DETECTED NOT DETECTED Final   Candida glabrata NOT DETECTED NOT DETECTED Final   Candida krusei NOT DETECTED NOT DETECTED Final   Candida parapsilosis NOT DETECTED NOT DETECTED Final   Candida tropicalis NOT DETECTED NOT DETECTED Final  Culture, blood (routine x 2)     Status: None (Preliminary result)    Collection Time: 09/07/16  3:26 PM  Result Value Ref Range Status   Specimen Description BLOOD RIGHT ARM  Final   Special Requests IN PEDIATRIC BOTTLE 2CC  Final   Culture  Setup Time   Final    GRAM POSITIVE COCCI IN CLUSTERS IN PEDIATRIC BOTTLE CRITICAL RESULT CALLED TO, READ BACK BY AND VERIFIED WITH: S. JEFFERSD, NURSE AT 0742 ON [id number] BY S. YARBROUGH    Culture TOO YOUNG TO READ  Final   Report Status PENDING  Incomplete  Culture, blood (routine x 2)     Status: None (Preliminary result)   Collection Time: 09/07/16  3:33 PM  Result Value Ref Range Status   Specimen Description BLOOD RIGHT HAND  Final   Special Requests IN PEDIATRIC BOTTLE [facility]  Final  Culture  Setup Time   Final    GRAM POSITIVE COCCI IN CLUSTERS IN PEDIATRIC BOTTLE CRITICAL RESULT CALLED TO, READ BACK BY AND VERIFIED WITH: S. JEFFERS, NURSE AT 0742 ON [id number] BY S. YARBROUGH    Culture TOO YOUNG TO READ  Final   Report Status PENDING  Incomplete    Coagulation Studies: No results for input(s): LABPROT, INR in the last 72 hours.  Urinalysis: No results for input(s): COLORURINE, LABSPEC, PHURINE, GLUCOSEU, HGBUR, BILIRUBINUR, KETONESUR, PROTEINUR, UROBILINOGEN, NITRITE, LEUKOCYTESUR in the last 72 hours.  Invalid input(s): APPERANCEUR    Imaging: Ct Lumbar Spine Wo Contrast  Result Date: 09/08/2016 CLINICAL DATA:  Low back pain, difficulty sitting. Sacral decubitus ulcers with wound VAC, assess for discitis. EXAM: CT LUMBAR SPINE WITHOUT CONTRAST TECHNIQUE: Multidetector CT imaging of the lumbar spine was performed without intravenous contrast administration. Multiplanar CT image reconstructions were also generated. COMPARISON:  None. FINDINGS: SEGMENTATION: For the purposes of this report the last well-formed intervertebral disc space is reported as L5-S1. ALIGNMENT: Vertebral bodies in alignment, maintenance of the lumbar lordosis. VERTEBRAE: Vertebral bodies and posterior elements are intact.  Intervertebral disc heights preserved, mild ventral endplate spurring E4-54-5. No destructive bony lesions. Sacroiliac ankylosis. PARASPINAL AND OTHER SOFT TISSUES: Included prevertebral and paraspinal soft tissues are nonsuspicious. Severe vascular calcifications. Atrophic kidneys. DISC LEVELS: T12-L1 and L1-2: No disc bulge, canal stenosis nor neural foraminal narrowing. L2-3: Annular bulging, mild facet arthropathy and ligamentum flavum redundancy without canal stenosis or neural foraminal narrowing. L3-4: Small broad-based disc osteophyte complex asymmetric to the RIGHT. Mild facet arthropathy and ligamentum flavum redundancy. No canal stenosis or neural foraminal narrowing. L4-5: Moderate broad-based disc osteophyte complex asymmetric to the RIGHT could affect the exited RIGHT L4 nerve. Severe facet arthropathy and ligamentum flavum redundancy. Mild canal stenosis including partial effacement of RIGHT lateral recess which may affect the traversing RIGHT L4 nerve. Mild RIGHT neural foraminal narrowing. L5-S1: Small broad-based disc osteophyte complex. Severe RIGHT and moderate LEFT facet arthropathy and ligamentum flavum redundancy without canal stenosis. Moderate RIGHT, mild LEFT neural foraminal narrowing. IMPRESSION: No discitis or osteomyelitis by CT though MRI is more sensitive. No acute fracture or malalignment. Degenerative change of lumbar spine results in mild canal stenosis L4-5. L4-5 and L5-S1 neural foraminal narrowing: Moderate on the RIGHT at L5-S1. Severe atherosclerosis. Electronically Signed   By: Awilda Metroourtnay  Bloomer M.D.   On: 09/08/2016 16:34   Ir Fluoro Guide Cv Line Right  Result Date: 09/08/2016 INDICATION: History of bacteremia. Please perform fluoroscopic guided dialysis catheter exchange EXAM: TUNNELED CENTRAL VENOUS HEMODIALYSIS CATHETER REPLACEMENT WITH FLUOROSCOPIC GUIDANCE MEDICATIONS: None. The patient is currently admitted to the hospital receiving intravenous antibiotics.  FLUOROSCOPY TIME:  36 seconds (6.4 mGy) COMPLICATIONS: None immediate. PROCEDURE: Informed written consent was obtained from the patient after a discussion of the risks, benefits, and alternatives to treatment. Questions regarding the procedure were encouraged and answered. The skin and external portion of the existing hemodialysis catheter was prepped with chlorhexidine in a sterile fashion, and a sterile drape was applied covering the operative field. Maximum barrier sterile technique with sterile gowns and gloves were used for the procedure. A timeout was performed prior to the initiation of the procedure. Both lumens of the hemodialysis catheter were cannulated with a Glidewire and advanced to the level of the IVC. Under intermittent fluoroscopic guidance, the existing dialysis catheter was exchanged for a new 19 cm tip to cuff palindrome dialysis catheter with tips ultimately terminating within the superior aspect of the  right atrium. Final catheter positioning was confirmed and documented with a spot radiographic image. The catheter aspirates and flushes normally. The catheter was flushed with appropriate volume heparin dwells. The catheter exit site was secured with a 0-Prolene retention sutures. Both lumens were heparinized. A dressing was placed. The patient tolerated the procedure well without immediate post procedural complication. IMPRESSION: Successful replacement of 19 cm tip to cuff tunneled hemodialysis catheter with tips terminating within the right atrium. The catheter is ready for immediate use. Electronically Signed   By: Simonne ComeJohn  Watts M.D.   On: 09/08/2016 16:19     Medications:    Aranesp 100 Midodrine 5 TID Phoslo 667   Assessment/ Plan:  65 y.o. male  with a PMHx of ESRD on dialysis for 9 years, hypertension, coronary artery disease, previous CVA, severe peripheral vascular disease, bilateral above-the-knee amputations, who was admitted to [facility] on 08/26/2016 for ongoing management  of a very severe large sacral decubitus ulcer.   1.  ESRD on HD:  dialysis catheter exchanged 09/08/16 for bacteremia.  If bacteremia persists may need to d/c catheter all together and find another site.  We will plan for HD again tomorrow.    2.  Anemia chronic kidney disease.  hemoglobin down a bit to 8.3, continue to monitor, if hgb drops may need to consider ESA adjustment.  3.  Secondary hyperparathyroidism.  Phosphorus was down to 2.4 at last check. Continue phoslo as well as Sensipar.  4. Large sacral decubitus ulcer.  As per wound care team as well as hospitalist.  His disposition is in question as above as its unclear if he'll be able to sit up for HD.   5.  Hypotension:  continue midodrine       LOS: 0 Ellese Julius 11/15/20175:29 PM

## 2016-09-09 LAB — RENAL FUNCTION PANEL
Albumin: 2 g/dL — ABNORMAL LOW (ref 3.5–5.0)
Anion gap: 10 (ref 5–15)
BUN: 79 mg/dL — AB (ref 6–20)
CALCIUM: 8.3 mg/dL — AB (ref 8.9–10.3)
CHLORIDE: 103 mmol/L (ref 101–111)
CO2: 23 mmol/L (ref 22–32)
CREATININE: 11.7 mg/dL — AB (ref 0.61–1.24)
GFR calc Af Amer: 5 mL/min — ABNORMAL LOW (ref >60)
GFR calc non Af Amer: 4 mL/min — ABNORMAL LOW (ref >60)
GLUCOSE: 93 mg/dL (ref 65–99)
Phosphorus: 2.5 mg/dL (ref 2.5–4.6)
Potassium: 4.9 mmol/L (ref 3.5–5.1)
SODIUM: 136 mmol/L (ref 135–145)

## 2016-09-09 LAB — CBC
HCT: 27.3 % — ABNORMAL LOW (ref 39.0–52.0)
Hemoglobin: 8.4 g/dL — ABNORMAL LOW (ref 13.0–17.0)
MCH: 30.9 pg (ref 26.0–34.0)
MCHC: 30.8 g/dL (ref 30.0–36.0)
MCV: 100.4 fL — AB (ref 78.0–100.0)
PLATELETS: 254 10*3/uL (ref 150–400)
RBC: 2.72 MIL/uL — AB (ref 4.22–5.81)
RDW: 17.2 % — AB (ref 11.5–15.5)
WBC: 9.7 10*3/uL (ref 4.0–10.5)

## 2016-09-09 LAB — VANCOMYCIN, TROUGH: Vancomycin Tr: 18 ug/mL (ref 15–20)

## 2016-09-10 LAB — CULTURE, BLOOD (ROUTINE X 2)

## 2016-09-10 NOTE — Progress Notes (Signed)
Subjective:  Patient upset that his wife is in the hospital. Apparently she had a stroke. Patient had dialysis yesterday and will be having dialysis again tomorrow.   Objective:  Vital signs in last 24 hours:  Temperature 96.8 pulse 83 respirations 17 blood pressure 126/75  Physical Exam: General: No acute distress, laying in the bed, obese  HEENT Anicteric, moist mucous membranes  Neck supple  Pulm/lungs normal breathing effort, clear to auscultation  CVS/Heart No rub or gallop, S1S2  Abdomen:  Soft, nontender, nondistended, diverting colostomy  Extremities: Bilateral above-the-knee amputation  Neurologic: Alert, able to follow simple commands, depressed  Skin: Wound VAC to the sacral decubitus ulcer  Access: Right IJ PermCath       Basic Metabolic Panel:   Recent Labs Lab 09/04/16 0620 09/07/16 0604 09/09/16 0825  NA 134* 136 136  K 4.8 5.3* 4.9  CL 98* 102 103  CO2 25 25 23   GLUCOSE 89 103* 93  BUN 35* 56* 79*  CREATININE 8.90* 10.44* 11.70*  CALCIUM 8.3* 8.2* 8.3*  PHOS  --  2.4* 2.5     CBC:  Recent Labs Lab 09/04/16 0620 09/05/16 0617 09/07/16 0604 09/09/16 0825  WBC 16.3* 28.1* 15.1* 9.7  NEUTROABS  --  23.1*  --   --   HGB 9.3* 9.7* 8.3* 8.4*  HCT 30.7* 32.3* 27.5* 27.3*  MCV 102.3* 101.9* 100.7* 100.4*  PLT 294 262 278 254      Microbiology:  Recent Results (from the past 720 hour(s))  Culture, blood (routine x 2)     Status: None   Collection Time: 08/26/16  9:45 PM  Result Value Ref Range Status   Specimen Description BLOOD RIGHT ANTECUBITAL  Final   Special Requests BOTTLES DRAWN AEROBIC AND ANAEROBIC 5CC  Final   Culture NO GROWTH 5 DAYS  Final   Report Status 08/31/2016 FINAL  Final  Culture, blood (routine x 2)     Status: None   Collection Time: 08/26/16  9:52 PM  Result Value Ref Range Status   Specimen Description BLOOD RIGHT HAND  Final   Special Requests IN PEDIATRIC BOTTLE 3CC  Final   Culture NO GROWTH 5 DAYS  Final    Report Status 08/31/2016 FINAL  Final  Culture, blood (routine x 2)     Status: Abnormal   Collection Time: 09/04/16  2:15 PM  Result Value Ref Range Status   Specimen Description BLOOD RIGHT HAND  Final   Special Requests IN PEDIATRIC BOTTLE 5CC  Final   Culture  Setup Time   Final    IN PEDIATRIC BOTTLE GRAM POSITIVE COCCI IN CLUSTERS CRITICAL RESULT CALLED TO, READ BACK BY AND VERIFIED WITH: TO CMASEKO(RN) BY TCLEVELAND 09/05/2016 AT 6:28AM    Culture (A)  Final    STAPHYLOCOCCUS AUREUS SUSCEPTIBILITIES PERFORMED ON PREVIOUS CULTURE WITHIN THE LAST 5 DAYS.    Report Status 09/07/2016 FINAL  Final  Culture, blood (routine x 2)     Status: Abnormal   Collection Time: 09/04/16  2:25 PM  Result Value Ref Range Status   Specimen Description BLOOD RIGHT HAND  Final   Special Requests IN PEDIATRIC BOTTLE 2CC  Final   Culture  Setup Time   Final    GRAM POSITIVE COCCI IN CLUSTERS IN PEDIATRIC BOTTLE CRITICAL RESULT CALLED TO, READ BACK BY AND VERIFIED WITH: TO CMASEKO(RN) BY TCLEVELAND 09/05/2016 AT 6:28AM    Culture METHICILLIN RESISTANT STAPHYLOCOCCUS AUREUS (A)  Final   Report Status 09/07/2016 FINAL  Final  Organism ID, Bacteria METHICILLIN RESISTANT STAPHYLOCOCCUS AUREUS  Final      Susceptibility   Methicillin resistant staphylococcus aureus - MIC*    CIPROFLOXACIN >=8 RESISTANT Resistant     ERYTHROMYCIN <=0.25 SENSITIVE Sensitive     GENTAMICIN <=0.5 SENSITIVE Sensitive     OXACILLIN >=4 RESISTANT Resistant     TETRACYCLINE <=1 SENSITIVE Sensitive     VANCOMYCIN 1 SENSITIVE Sensitive     TRIMETH/SULFA <=10 SENSITIVE Sensitive     CLINDAMYCIN <=0.25 SENSITIVE Sensitive     RIFAMPIN <=0.5 SENSITIVE Sensitive     Inducible Clindamycin NEGATIVE Sensitive     * METHICILLIN RESISTANT STAPHYLOCOCCUS AUREUS  Blood Culture ID Panel (Reflexed)     Status: Abnormal   Collection Time: 09/04/16  2:25 PM  Result Value Ref Range Status   Enterococcus species NOT DETECTED NOT  DETECTED Final   Vancomycin resistance NOT DETECTED NOT DETECTED Final   Listeria monocytogenes NOT DETECTED NOT DETECTED Final   Staphylococcus species DETECTED (A) NOT DETECTED Final    Comment: CRITICAL RESULT CALLED TO, READ BACK BY AND VERIFIED WITH: TO CMASEKO(RN) BY TCLEVELAND 09/05/2016 AT 6:28AM    Staphylococcus aureus DETECTED (A) NOT DETECTED Final    Comment: CRITICAL RESULT CALLED TO, READ BACK BY AND VERIFIED WITH: TO CMASEKO(RN) BY TCLEVELAND 09/05/2016 AT 6:28AM    Methicillin resistance DETECTED (A) NOT DETECTED Final    Comment: CRITICAL RESULT CALLED TO, READ BACK BY AND VERIFIED WITH: TO CMASEKO(RN) BY TCLEVELAND 09/05/2016 AT 6:28AM    Streptococcus species NOT DETECTED NOT DETECTED Final   Streptococcus agalactiae NOT DETECTED NOT DETECTED Final   Streptococcus pneumoniae NOT DETECTED NOT DETECTED Final   Streptococcus pyogenes NOT DETECTED NOT DETECTED Final   Acinetobacter baumannii NOT DETECTED NOT DETECTED Final   Enterobacteriaceae species NOT DETECTED NOT DETECTED Final   Enterobacter cloacae complex NOT DETECTED NOT DETECTED Final   Escherichia coli NOT DETECTED NOT DETECTED Final   Klebsiella oxytoca NOT DETECTED NOT DETECTED Final   Klebsiella pneumoniae NOT DETECTED NOT DETECTED Final   Proteus species NOT DETECTED NOT DETECTED Final   Serratia marcescens NOT DETECTED NOT DETECTED Final   Carbapenem resistance NOT DETECTED NOT DETECTED Final   Haemophilus influenzae NOT DETECTED NOT DETECTED Final   Neisseria meningitidis NOT DETECTED NOT DETECTED Final   Pseudomonas aeruginosa NOT DETECTED NOT DETECTED Final   Candida albicans NOT DETECTED NOT DETECTED Final   Candida glabrata NOT DETECTED NOT DETECTED Final   Candida krusei NOT DETECTED NOT DETECTED Final   Candida parapsilosis NOT DETECTED NOT DETECTED Final   Candida tropicalis NOT DETECTED NOT DETECTED Final  Culture, blood (routine x 2)     Status: Abnormal   Collection Time: 09/07/16  3:26  PM  Result Value Ref Range Status   Specimen Description BLOOD RIGHT ARM  Final   Special Requests IN PEDIATRIC BOTTLE 2CC  Final   Culture  Setup Time   Final    GRAM POSITIVE COCCI IN CLUSTERS IN PEDIATRIC BOTTLE CRITICAL RESULT CALLED TO, READ BACK BY AND VERIFIED WITH: S. JEFFERSD, NURSE AT 0742 ON [id number] BY Lucienne CapersS. YARBROUGH    Culture METHICILLIN RESISTANT STAPHYLOCOCCUS AUREUS (A)  Final   Report Status 09/10/2016 FINAL  Final   Organism ID, Bacteria METHICILLIN RESISTANT STAPHYLOCOCCUS AUREUS  Final      Susceptibility   Methicillin resistant staphylococcus aureus - MIC*    CIPROFLOXACIN >=8 RESISTANT Resistant     ERYTHROMYCIN <=0.25 SENSITIVE Sensitive     GENTAMICIN <=0.5 SENSITIVE  Sensitive     OXACILLIN >=4 RESISTANT Resistant     TETRACYCLINE <=1 SENSITIVE Sensitive     VANCOMYCIN 1 SENSITIVE Sensitive     TRIMETH/SULFA <=10 SENSITIVE Sensitive     CLINDAMYCIN <=0.25 SENSITIVE Sensitive     RIFAMPIN <=0.5 SENSITIVE Sensitive     Inducible Clindamycin NEGATIVE Sensitive     * METHICILLIN RESISTANT STAPHYLOCOCCUS AUREUS  Culture, blood (routine x 2)     Status: Abnormal   Collection Time: 09/07/16  3:33 PM  Result Value Ref Range Status   Specimen Description BLOOD RIGHT HAND  Final   Special Requests IN PEDIATRIC BOTTLE 2CC  Final   Culture  Setup Time   Final    GRAM POSITIVE COCCI IN CLUSTERS IN PEDIATRIC BOTTLE CRITICAL RESULT CALLED TO, READ BACK BY AND VERIFIED WITH: S. JEFFERS, NURSE AT 0742 ON [id number] BY Lucienne Capers    Culture (A)  Final    STAPHYLOCOCCUS AUREUS SUSCEPTIBILITIES PERFORMED ON PREVIOUS CULTURE WITHIN THE LAST 5 DAYS.    Report Status 09/10/2016 FINAL  Final    Coagulation Studies: No results for input(s): LABPROT, INR in the last 72 hours.  Urinalysis: No results for input(s): COLORURINE, LABSPEC, PHURINE, GLUCOSEU, HGBUR, BILIRUBINUR, KETONESUR, PROTEINUR, UROBILINOGEN, NITRITE, LEUKOCYTESUR in the last 72 hours.  Invalid input(s):  APPERANCEUR    Imaging: No results found.   Medications:    Aranesp 100 Midodrine 5 TID Phoslo 667   Assessment/ Plan:  65 y.o. male  with a PMHx of ESRD on dialysis for 9 years, hypertension, coronary artery disease, previous CVA, severe peripheral vascular disease, bilateral above-the-knee amputations, who was admitted to [facility] on 08/26/2016 for ongoing management of a very severe large sacral decubitus ulcer.   1.  ESRD on HD:  dialysis catheter exchanged 09/08/16 for bacteremia.  -  Repeat blood cultures taken today. If these are still positive recommend removal of dialysis catheter but for now may continue. Next dialysis on Monday.   2.  Anemia chronic kidney disease.  Hemoglobin currently 8.4.  Continue aranesp [place]ins weekly.   3.  Secondary hyperparathyroidism.  hosphorus 2.5. Continue PhosLo and Sensipar.  4. Large sacral decubitus ulcer.  As per wound care team as well as hospitalist.  His disposition is in question as above as its unclear if he'll be able to sit up for HD.   5.  Hypotension:  continue midodrine.      LOS: 0 Shrihaan Porzio 11/17/20175:07 PM

## 2016-09-11 LAB — RENAL FUNCTION PANEL
ANION GAP: 12 (ref 5–15)
Albumin: 2.1 g/dL — ABNORMAL LOW (ref 3.5–5.0)
BUN: 45 mg/dL — ABNORMAL HIGH (ref 6–20)
CHLORIDE: 99 mmol/L — AB (ref 101–111)
CO2: 23 mmol/L (ref 22–32)
Calcium: 8.1 mg/dL — ABNORMAL LOW (ref 8.9–10.3)
Creatinine, Ser: 8.32 mg/dL — ABNORMAL HIGH (ref 0.61–1.24)
GFR, EST AFRICAN AMERICAN: 7 mL/min — AB (ref >60)
GFR, EST NON AFRICAN AMERICAN: 6 mL/min — AB (ref >60)
Glucose, Bld: 86 mg/dL (ref 65–99)
Phosphorus: 3.3 mg/dL (ref 2.5–4.6)
Potassium: 4.6 mmol/L (ref 3.5–5.1)
Sodium: 134 mmol/L — ABNORMAL LOW (ref 135–145)

## 2016-09-11 LAB — CBC
HCT: 28.8 % — ABNORMAL LOW (ref 39.0–52.0)
Hemoglobin: 8.7 g/dL — ABNORMAL LOW (ref 13.0–17.0)
MCH: 30.3 pg (ref 26.0–34.0)
MCHC: 30.2 g/dL (ref 30.0–36.0)
MCV: 100.3 fL — AB (ref 78.0–100.0)
Platelets: 245 10*3/uL (ref 150–400)
RBC: 2.87 MIL/uL — AB (ref 4.22–5.81)
RDW: 16.7 % — ABNORMAL HIGH (ref 11.5–15.5)
WBC: 10.2 10*3/uL (ref 4.0–10.5)

## 2016-09-12 LAB — CULTURE, BLOOD (ROUTINE X 2)

## 2016-09-13 ENCOUNTER — Other Ambulatory Visit (HOSPITAL_COMMUNITY): Payer: Medicare Other

## 2016-09-13 ENCOUNTER — Encounter (HOSPITAL_COMMUNITY): Payer: Self-pay | Admitting: Interventional Radiology

## 2016-09-13 HISTORY — PX: IR GENERIC HISTORICAL: IMG1180011

## 2016-09-13 LAB — VANCOMYCIN, TROUGH: Vancomycin Tr: 16 ug/mL (ref 15–20)

## 2016-09-13 MED ORDER — CHLORHEXIDINE GLUCONATE 4 % EX LIQD
CUTANEOUS | Status: AC
  Administered 2016-09-13: 15:00:00
  Filled 2016-09-13: qty 15

## 2016-09-13 MED ORDER — LIDOCAINE HCL 1 % IJ SOLN
INTRAMUSCULAR | Status: DC | PRN
  Administered 2016-09-13: 10 mL

## 2016-09-13 MED ORDER — HEPARIN SODIUM (PORCINE) 1000 UNIT/ML IJ SOLN
INTRAMUSCULAR | Status: AC
  Administered 2016-09-13: 2800 [IU]
  Filled 2016-09-13: qty 1

## 2016-09-13 MED ORDER — LIDOCAINE HCL 1 % IJ SOLN
INTRAMUSCULAR | Status: AC
  Filled 2016-09-13: qty 20

## 2016-09-13 NOTE — Progress Notes (Signed)
Subjective:  Patient seen at bedside. Discussed his sacral wound the wound care nurse. Patient's wife back at his bedside.    Objective:  Vital signs in last 24 hours:  temperature 98.3 pulse 94 respirations 16 blood pressure 117/70  Physical Exam: General: No acute distress, laying in the bed, obese  HEENT Anicteric, moist mucous membranes  Neck supple  Pulm/lungs normal breathing effort, clear to auscultation  CVS/Heart No rub or gallop, S1S2  Abdomen:  Soft, nontender, nondistended, diverting colostomy  Extremities: Bilateral above-the-knee amputation  Neurologic: Alert, able to follow simple commands, depressed  Skin: Wound VAC to the sacral decubitus ulcer  Access: Right IJ PermCath       Basic Metabolic Panel:   Recent Labs Lab 09/07/16 0604 09/09/16 0825 09/11/16 0637  NA 136 136 134*  K 5.3* 4.9 4.6  CL 102 103 99*  CO2 25 23 23   GLUCOSE 103* 93 86  BUN 56* 79* 45*  CREATININE 10.44* 11.70* 8.32*  CALCIUM 8.2* 8.3* 8.1*  PHOS 2.4* 2.5 3.3     CBC:  Recent Labs Lab 09/07/16 0604 09/09/16 0825 09/11/16 0637  WBC 15.1* 9.7 10.2  HGB 8.3* 8.4* 8.7*  HCT 27.5* 27.3* 28.8*  MCV 100.7* 100.4* 100.3*  PLT 278 254 245      Microbiology:  Recent Results (from the past 720 hour(s))  Culture, blood (routine x 2)     Status: None   Collection Time: 08/26/16  9:45 PM  Result Value Ref Range Status   Specimen Description BLOOD RIGHT ANTECUBITAL  Final   Special Requests BOTTLES DRAWN AEROBIC AND ANAEROBIC 5CC  Final   Culture NO GROWTH 5 DAYS  Final   Report Status 08/31/2016 FINAL  Final  Culture, blood (routine x 2)     Status: None   Collection Time: 08/26/16  9:52 PM  Result Value Ref Range Status   Specimen Description BLOOD RIGHT HAND  Final   Special Requests IN PEDIATRIC BOTTLE 3CC  Final   Culture NO GROWTH 5 DAYS  Final   Report Status 08/31/2016 FINAL  Final  Culture, blood (routine x 2)     Status: Abnormal   Collection Time:  09/04/16  2:15 PM  Result Value Ref Range Status   Specimen Description BLOOD RIGHT HAND  Final   Special Requests IN PEDIATRIC BOTTLE 5CC  Final   Culture  Setup Time   Final    IN PEDIATRIC BOTTLE GRAM POSITIVE COCCI IN CLUSTERS CRITICAL RESULT CALLED TO, READ BACK BY AND VERIFIED WITH: TO CMASEKO(RN) BY TCLEVELAND 09/05/2016 AT 6:28AM    Culture (A)  Final    STAPHYLOCOCCUS AUREUS SUSCEPTIBILITIES PERFORMED ON PREVIOUS CULTURE WITHIN THE LAST 5 DAYS.    Report Status 09/07/2016 FINAL  Final  Culture, blood (routine x 2)     Status: Abnormal   Collection Time: 09/04/16  2:25 PM  Result Value Ref Range Status   Specimen Description BLOOD RIGHT HAND  Final   Special Requests IN PEDIATRIC BOTTLE 2CC  Final   Culture  Setup Time   Final    GRAM POSITIVE COCCI IN CLUSTERS IN PEDIATRIC BOTTLE CRITICAL RESULT CALLED TO, READ BACK BY AND VERIFIED WITH: TO CMASEKO(RN) BY TCLEVELAND 09/05/2016 AT 6:28AM    Culture METHICILLIN RESISTANT STAPHYLOCOCCUS AUREUS (A)  Final   Report Status 09/07/2016 FINAL  Final   Organism ID, Bacteria METHICILLIN RESISTANT STAPHYLOCOCCUS AUREUS  Final      Susceptibility   Methicillin resistant staphylococcus aureus - MIC*  CIPROFLOXACIN >=8 RESISTANT Resistant     ERYTHROMYCIN <=0.25 SENSITIVE Sensitive     GENTAMICIN <=0.5 SENSITIVE Sensitive     OXACILLIN >=4 RESISTANT Resistant     TETRACYCLINE <=1 SENSITIVE Sensitive     VANCOMYCIN 1 SENSITIVE Sensitive     TRIMETH/SULFA <=10 SENSITIVE Sensitive     CLINDAMYCIN <=0.25 SENSITIVE Sensitive     RIFAMPIN <=0.5 SENSITIVE Sensitive     Inducible Clindamycin NEGATIVE Sensitive     * METHICILLIN RESISTANT STAPHYLOCOCCUS AUREUS  Blood Culture ID Panel (Reflexed)     Status: Abnormal   Collection Time: 09/04/16  2:25 PM  Result Value Ref Range Status   Enterococcus species NOT DETECTED NOT DETECTED Final   Vancomycin resistance NOT DETECTED NOT DETECTED Final   Listeria monocytogenes NOT DETECTED NOT  DETECTED Final   Staphylococcus species DETECTED (A) NOT DETECTED Final    Comment: CRITICAL RESULT CALLED TO, READ BACK BY AND VERIFIED WITH: TO CMASEKO(RN) BY TCLEVELAND 09/05/2016 AT 6:28AM    Staphylococcus aureus DETECTED (A) NOT DETECTED Final    Comment: CRITICAL RESULT CALLED TO, READ BACK BY AND VERIFIED WITH: TO CMASEKO(RN) BY TCLEVELAND 09/05/2016 AT 6:28AM    Methicillin resistance DETECTED (A) NOT DETECTED Final    Comment: CRITICAL RESULT CALLED TO, READ BACK BY AND VERIFIED WITH: TO CMASEKO(RN) BY TCLEVELAND 09/05/2016 AT 6:28AM    Streptococcus species NOT DETECTED NOT DETECTED Final   Streptococcus agalactiae NOT DETECTED NOT DETECTED Final   Streptococcus pneumoniae NOT DETECTED NOT DETECTED Final   Streptococcus pyogenes NOT DETECTED NOT DETECTED Final   Acinetobacter baumannii NOT DETECTED NOT DETECTED Final   Enterobacteriaceae species NOT DETECTED NOT DETECTED Final   Enterobacter cloacae complex NOT DETECTED NOT DETECTED Final   Escherichia coli NOT DETECTED NOT DETECTED Final   Klebsiella oxytoca NOT DETECTED NOT DETECTED Final   Klebsiella pneumoniae NOT DETECTED NOT DETECTED Final   Proteus species NOT DETECTED NOT DETECTED Final   Serratia marcescens NOT DETECTED NOT DETECTED Final   Carbapenem resistance NOT DETECTED NOT DETECTED Final   Haemophilus influenzae NOT DETECTED NOT DETECTED Final   Neisseria meningitidis NOT DETECTED NOT DETECTED Final   Pseudomonas aeruginosa NOT DETECTED NOT DETECTED Final   Candida albicans NOT DETECTED NOT DETECTED Final   Candida glabrata NOT DETECTED NOT DETECTED Final   Candida krusei NOT DETECTED NOT DETECTED Final   Candida parapsilosis NOT DETECTED NOT DETECTED Final   Candida tropicalis NOT DETECTED NOT DETECTED Final  Culture, blood (routine x 2)     Status: Abnormal   Collection Time: 09/07/16  3:26 PM  Result Value Ref Range Status   Specimen Description BLOOD RIGHT ARM  Final   Special Requests IN PEDIATRIC  BOTTLE 2CC  Final   Culture  Setup Time   Final    GRAM POSITIVE COCCI IN CLUSTERS IN PEDIATRIC BOTTLE CRITICAL RESULT CALLED TO, READ BACK BY AND VERIFIED WITH: S. JEFFERSD, NURSE AT 0742 ON [id number] BY Lucienne Capers    Culture METHICILLIN RESISTANT STAPHYLOCOCCUS AUREUS (A)  Final   Report Status 09/10/2016 FINAL  Final   Organism ID, Bacteria METHICILLIN RESISTANT STAPHYLOCOCCUS AUREUS  Final      Susceptibility   Methicillin resistant staphylococcus aureus - MIC*    CIPROFLOXACIN >=8 RESISTANT Resistant     ERYTHROMYCIN <=0.25 SENSITIVE Sensitive     GENTAMICIN <=0.5 SENSITIVE Sensitive     OXACILLIN >=4 RESISTANT Resistant     TETRACYCLINE <=1 SENSITIVE Sensitive     VANCOMYCIN 1 SENSITIVE Sensitive  TRIMETH/SULFA <=10 SENSITIVE Sensitive     CLINDAMYCIN <=0.25 SENSITIVE Sensitive     RIFAMPIN <=0.5 SENSITIVE Sensitive     Inducible Clindamycin NEGATIVE Sensitive     * METHICILLIN RESISTANT STAPHYLOCOCCUS AUREUS  Culture, blood (routine x 2)     Status: Abnormal   Collection Time: 09/07/16  3:33 PM  Result Value Ref Range Status   Specimen Description BLOOD RIGHT HAND  Final   Special Requests IN PEDIATRIC BOTTLE 2CC  Final   Culture  Setup Time   Final    GRAM POSITIVE COCCI IN CLUSTERS IN PEDIATRIC BOTTLE CRITICAL RESULT CALLED TO, READ BACK BY AND VERIFIED WITH: S. JEFFERS, NURSE AT 0742 ON [id number] BY Lucienne CapersS. YARBROUGH    Culture (A)  Final    STAPHYLOCOCCUS AUREUS SUSCEPTIBILITIES PERFORMED ON PREVIOUS CULTURE WITHIN THE LAST 5 DAYS.    Report Status 09/10/2016 FINAL  Final  Culture, blood (routine x 2)     Status: None (Preliminary result)   Collection Time: 09/10/16  3:30 PM  Result Value Ref Range Status   Specimen Description BLOOD RIGHT ARM  Final   Special Requests IN PEDIATRIC BOTTLE 1CC  Final   Culture NO GROWTH 3 DAYS  Final   Report Status PENDING  Incomplete  Culture, blood (routine x 2)     Status: Abnormal   Collection Time: 09/10/16  3:32 PM  Result  Value Ref Range Status   Specimen Description BLOOD RIGHT ARM  Final   Special Requests IN PEDIATRIC BOTTLE .5CC  Final   Culture  Setup Time   Final    GRAM POSITIVE COCCI IN CLUSTERS IN PEDIATRIC BOTTLE CRITICAL RESULT CALLED TO, READ BACK BY AND VERIFIED WITH: M. PERRY, NURSE AT 1306 ON [id number] BY Lucienne CapersS. YARBROUGH    Culture (A)  Final    STAPHYLOCOCCUS AUREUS SUSCEPTIBILITIES PERFORMED ON PREVIOUS CULTURE WITHIN THE LAST 5 DAYS.    Report Status 09/12/2016 FINAL  Final    Coagulation Studies: No results for input(s): LABPROT, INR in the last 72 hours.  Urinalysis: No results for input(s): COLORURINE, LABSPEC, PHURINE, GLUCOSEU, HGBUR, BILIRUBINUR, KETONESUR, PROTEINUR, UROBILINOGEN, NITRITE, LEUKOCYTESUR in the last 72 hours.  Invalid input(s): APPERANCEUR    Imaging: Ir Fluoro Guide Cv Line Right  Result Date: 09/13/2016 CLINICAL DATA:  Bacteremia and need to remove tunneled right-sided dialysis catheter. Request has been made to remove the tunneled catheter and place a temporary non tunneled dialysis catheter. EXAM: REMOVAL OF TUNNELED CENTRAL VENOUS CATHETER NON-TUNNELED CENTRAL VENOUS CATHETER PLACEMENT WITH ULTRASOUND AND FLUOROSCOPIC GUIDANCE FLUOROSCOPY TIME:  30 seconds. PROCEDURE: The procedure, risks, benefits, and alternatives were explained to the patient. Questions regarding the procedure were encouraged and answered. The patient understands and consents to the procedure. A time-out was performed prior to the procedure. The pre-existing dialysis catheter was prepped with chlorhexidine. The catheter was removed utilizing blunt dissection and manual traction. The right neck and chest were prepped with chlorhexidine in a sterile fashion, and a sterile drape was applied covering the operative field. Maximum barrier sterile technique with sterile gowns and gloves were used for the procedure. Local anesthesia was provided with 1% lidocaine. After creating a small venotomy incision,  a 21 gauge needle was advanced into the right internal jugular vein under direct, real-time ultrasound guidance. Ultrasound image documentation was performed. The venotomy was dilated over a guidewire. A 12.5 French, 20 cm non tunneled hemodialysis catheter was then advanced over the wire. Catheter positioning was confirmed by fluoroscopy and a fluoroscopic spot image  saved. The catheter was aspirated, flushed with saline, and injected with appropriate volume heparin dwells. The catheter exit site was secured with 0-Prolene retention sutures. COMPLICATIONS: None.  No pneumothorax. FINDINGS: The pre-existing tunneled catheter was removed without difficulty in its entirety. After catheter placement, the tip lies at the cavoatrial junction. The catheter aspirates normally and is ready for immediate use. IMPRESSION: Removal of tunneled right-sided hemodialysis catheter. Placement of new non-tunneled hemodialysis catheter via the right internal jugular vein. The catheter tip lies at the cavoatrial junction. The catheter is ready for immediate use. Electronically Signed   By: Irish Lack M.D.   On: 09/13/2016 17:03   Ir Removal Tun Cv Cath W/o Fl  Result Date: 09/13/2016 CLINICAL DATA:  Bacteremia and need to remove tunneled right-sided dialysis catheter. Request has been made to remove the tunneled catheter and place a temporary non tunneled dialysis catheter. EXAM: REMOVAL OF TUNNELED CENTRAL VENOUS CATHETER NON-TUNNELED CENTRAL VENOUS CATHETER PLACEMENT WITH ULTRASOUND AND FLUOROSCOPIC GUIDANCE FLUOROSCOPY TIME:  30 seconds. PROCEDURE: The procedure, risks, benefits, and alternatives were explained to the patient. Questions regarding the procedure were encouraged and answered. The patient understands and consents to the procedure. A time-out was performed prior to the procedure. The pre-existing dialysis catheter was prepped with chlorhexidine. The catheter was removed utilizing blunt dissection and manual  traction. The right neck and chest were prepped with chlorhexidine in a sterile fashion, and a sterile drape was applied covering the operative field. Maximum barrier sterile technique with sterile gowns and gloves were used for the procedure. Local anesthesia was provided with 1% lidocaine. After creating a small venotomy incision, a 21 gauge needle was advanced into the right internal jugular vein under direct, real-time ultrasound guidance. Ultrasound image documentation was performed. The venotomy was dilated over a guidewire. A 12.5 French, 20 cm non tunneled hemodialysis catheter was then advanced over the wire. Catheter positioning was confirmed by fluoroscopy and a fluoroscopic spot image saved. The catheter was aspirated, flushed with saline, and injected with appropriate volume heparin dwells. The catheter exit site was secured with 0-Prolene retention sutures. COMPLICATIONS: None.  No pneumothorax. FINDINGS: The pre-existing tunneled catheter was removed without difficulty in its entirety. After catheter placement, the tip lies at the cavoatrial junction. The catheter aspirates normally and is ready for immediate use. IMPRESSION: Removal of tunneled right-sided hemodialysis catheter. Placement of new non-tunneled hemodialysis catheter via the right internal jugular vein. The catheter tip lies at the cavoatrial junction. The catheter is ready for immediate use. Electronically Signed   By: Irish Lack M.D.   On: 09/13/2016 17:03   Ir US Guide Vasc Access Right  Result Date: 09/13/2016 CLINICAL DATA:  Bacteremia and need to remove tunneled right-sided dialysis catheter. Request has been made to remove the tunneled catheter and place a temporary non tunneled dialysis catheter. EXAM: REMOVAL OF TUNNELED CENTRAL VENOUS CATHETER NON-TUNNELED CENTRAL VENOUS CATHETER PLACEMENT WITH ULTRASOUND AND FLUOROSCOPIC GUIDANCE FLUOROSCOPY TIME:  30 seconds. PROCEDURE: The procedure, risks, benefits, and  alternatives were explained to the patient. Questions regarding the procedure were encouraged and answered. The patient understands and consents to the procedure. A time-out was performed prior to the procedure. The pre-existing dialysis catheter was prepped with chlorhexidine. The catheter was removed utilizing blunt dissection and manual traction. The right neck and chest were prepped with chlorhexidine in a sterile fashion, and a sterile drape was applied covering the operative field. Maximum barrier sterile technique with sterile gowns and gloves were used for the procedure. Local  anesthesia was provided with 1% lidocaine. After creating a small venotomy incision, a 21 gauge needle was advanced into the right internal jugular vein under direct, real-time ultrasound guidance. Ultrasound image documentation was performed. The venotomy was dilated over a guidewire. A 12.5 French, 20 cm non tunneled hemodialysis catheter was then advanced over the wire. Catheter positioning was confirmed by fluoroscopy and a fluoroscopic spot image saved. The catheter was aspirated, flushed with saline, and injected with appropriate volume heparin dwells. The catheter exit site was secured with 0-Prolene retention sutures. COMPLICATIONS: None.  No pneumothorax. FINDINGS: The pre-existing tunneled catheter was removed without difficulty in its entirety. After catheter placement, the tip lies at the cavoatrial junction. The catheter aspirates normally and is ready for immediate use. IMPRESSION: Removal of tunneled right-sided hemodialysis catheter. Placement of new non-tunneled hemodialysis catheter via the right internal jugular vein. The catheter tip lies at the cavoatrial junction. The catheter is ready for immediate use. Electronically Signed   By: Irish LackGlenn  Yamagata M.D.   On: 09/13/2016 17:03     Medications:    Aranesp 100 Midodrine 5 TID Phoslo 667   Assessment/ Plan:  65 y.o. male  with a PMHx of ESRD on dialysis  for 9 years, hypertension, coronary artery disease, previous CVA, severe peripheral vascular disease, bilateral above-the-knee amputations, who was admitted to [facility] on 08/26/2016 for ongoing management of a very severe large sacral decubitus ulcer.   1.  ESRD on HD:  dialysis catheter exchanged 09/08/16 for bacteremia.  -  Blood cultures from 09/10/2016 were still positive. Therefore PermCath was removed today a new temporary dialysis catheter placed. Antibiotic therapy per hospitalist. Next dialysis treatment tomorrow given the holiday.   2.  Anemia chronic kidney disease.  hemoglobin up slightly to 8.7. Continue Aranesp.  3.  Secondary hyperparathyroidism.  Phosphorus acceptable at 3.3. Continue PhosLo and Sensipar.  4. Large sacral decubitus ulcer.  patient will need to be able to sitfor approximately 5-6 hours to be able to be transitioned to outpatient dialysis.   5.  Hypotension:  continue midodrine, blood pressure 117/70.      LOS: 0 Torryn Fiske 11/20/20175:18 PM

## 2016-09-13 NOTE — Procedures (Signed)
Interventional Radiology Procedure Note  Procedure:  Tunneled HD catheter removal; nontunneled HD catheter placement  Complications:  None  Estimated Blood Loss: < 10 mL  Tunneled HD catheter removed due to infection.  Non-tunneled 20 cm HD catheter placed via right IJ vein with tip in upper RA.  OK to use temp cath.  Jodi MarbleGlenn T. Fredia SorrowYamagata, M.D Pager:  [phone]

## 2016-09-14 LAB — RENAL FUNCTION PANEL
Albumin: 2.8 g/dL — ABNORMAL LOW (ref 3.5–5.0)
Anion gap: 14 (ref 5–15)
BUN: 20 mg/dL (ref 6–20)
CHLORIDE: 96 mmol/L — AB (ref 101–111)
CO2: 23 mmol/L (ref 22–32)
CREATININE: 5.57 mg/dL — AB (ref 0.61–1.24)
Calcium: 8.6 mg/dL — ABNORMAL LOW (ref 8.9–10.3)
GFR calc non Af Amer: 10 mL/min — ABNORMAL LOW (ref >60)
GFR, EST AFRICAN AMERICAN: 11 mL/min — AB (ref >60)
GLUCOSE: 96 mg/dL (ref 65–99)
Phosphorus: 2.7 mg/dL (ref 2.5–4.6)
Potassium: 4 mmol/L (ref 3.5–5.1)
Sodium: 133 mmol/L — ABNORMAL LOW (ref 135–145)

## 2016-09-14 LAB — CBC
HCT: 32.9 % — ABNORMAL LOW (ref 39.0–52.0)
Hemoglobin: 9.9 g/dL — ABNORMAL LOW (ref 13.0–17.0)
MCH: 30.7 pg (ref 26.0–34.0)
MCHC: 30.1 g/dL (ref 30.0–36.0)
MCV: 101.9 fL — AB (ref 78.0–100.0)
PLATELETS: 363 10*3/uL (ref 150–400)
RBC: 3.23 MIL/uL — AB (ref 4.22–5.81)
RDW: 17.1 % — ABNORMAL HIGH (ref 11.5–15.5)
WBC: 12.1 10*3/uL — ABNORMAL HIGH (ref 4.0–10.5)

## 2016-09-14 LAB — VANCOMYCIN, TROUGH: Vancomycin Tr: 25 ug/mL (ref 15–20)

## 2016-09-15 LAB — CULTURE, BLOOD (ROUTINE X 2): Culture: NO GROWTH

## 2016-09-15 LAB — CBC
HCT: 28.9 % — ABNORMAL LOW (ref 39.0–52.0)
Hemoglobin: 8.7 g/dL — ABNORMAL LOW (ref 13.0–17.0)
MCH: 30.4 pg (ref 26.0–34.0)
MCHC: 30.1 g/dL (ref 30.0–36.0)
MCV: 101 fL — ABNORMAL HIGH (ref 78.0–100.0)
PLATELETS: 396 10*3/uL (ref 150–400)
RBC: 2.86 MIL/uL — AB (ref 4.22–5.81)
RDW: 17.4 % — ABNORMAL HIGH (ref 11.5–15.5)
WBC: 12.6 10*3/uL — ABNORMAL HIGH (ref 4.0–10.5)

## 2016-09-15 LAB — RENAL FUNCTION PANEL
Albumin: 2.4 g/dL — ABNORMAL LOW (ref 3.5–5.0)
Anion gap: 9 (ref 5–15)
BUN: 28 mg/dL — AB (ref 6–20)
CALCIUM: 8.6 mg/dL — AB (ref 8.9–10.3)
CO2: 28 mmol/L (ref 22–32)
CREATININE: 7.37 mg/dL — AB (ref 0.61–1.24)
Chloride: 100 mmol/L — ABNORMAL LOW (ref 101–111)
GFR calc Af Amer: 8 mL/min — ABNORMAL LOW (ref >60)
GFR calc non Af Amer: 7 mL/min — ABNORMAL LOW (ref >60)
GLUCOSE: 92 mg/dL (ref 65–99)
Phosphorus: 3 mg/dL (ref 2.5–4.6)
Potassium: 4.1 mmol/L (ref 3.5–5.1)
SODIUM: 137 mmol/L (ref 135–145)

## 2016-09-15 NOTE — Progress Notes (Signed)
Subjective:  Patient completed the hemodialysis today. He is resting comfortably in bed. Wound care nurse continues to follow him closely.   Objective:  Vital signs in last 24 hours:  Temperature 96.8 pulse 114 respirations 16 blood pressure 95/53  Physical Exam: General: No acute distress, laying in the bed, obese  HEENT Anicteric, moist mucous membranes  Neck supple  Pulm/lungs normal breathing effort, clear to auscultation  CVS/Heart No rub or gallop, S1S2  Abdomen:  Soft, nontender, nondistended, diverting colostomy  Extremities: Bilateral above-the-knee amputation  Neurologic: Alert, able to follow simple commands, depressed  Skin: Wound VAC to the sacral decubitus ulcer  Access: Right IJ PermCath       Basic Metabolic Panel:   Recent Labs Lab 09/09/16 0825 09/11/16 0637 09/14/16 0801 09/15/16 0730  NA 136 134* 133* 137  K 4.9 4.6 4.0 4.1  CL 103 99* 96* 100*  CO2 23 23 23 28   GLUCOSE 93 86 96 92  BUN 79* 45* 20 28*  CREATININE 11.70* 8.32* 5.57* 7.37*  CALCIUM 8.3* 8.1* 8.6* 8.6*  PHOS 2.5 3.3 2.7 3.0     CBC:  Recent Labs Lab 09/09/16 0825 09/11/16 0637 09/14/16 0801 09/15/16 0730  WBC 9.7 10.2 12.1* 12.6*  HGB 8.4* 8.7* 9.9* 8.7*  HCT 27.3* 28.8* 32.9* 28.9*  MCV 100.4* 100.3* 101.9* 101.0*  PLT 254 245 363 396      Microbiology:  Recent Results (from the past 720 hour(s))  Culture, blood (routine x 2)     Status: None   Collection Time: 08/26/16  9:45 PM  Result Value Ref Range Status   Specimen Description BLOOD RIGHT ANTECUBITAL  Final   Special Requests BOTTLES DRAWN AEROBIC AND ANAEROBIC 5CC  Final   Culture NO GROWTH 5 DAYS  Final   Report Status 08/31/2016 FINAL  Final  Culture, blood (routine x 2)     Status: None   Collection Time: 08/26/16  9:52 PM  Result Value Ref Range Status   Specimen Description BLOOD RIGHT HAND  Final   Special Requests IN PEDIATRIC BOTTLE 3CC  Final   Culture NO GROWTH 5 DAYS  Final   Report  Status 08/31/2016 FINAL  Final  Culture, blood (routine x 2)     Status: Abnormal   Collection Time: 09/04/16  2:15 PM  Result Value Ref Range Status   Specimen Description BLOOD RIGHT HAND  Final   Special Requests IN PEDIATRIC BOTTLE 5CC  Final   Culture  Setup Time   Final    IN PEDIATRIC BOTTLE GRAM POSITIVE COCCI IN CLUSTERS CRITICAL RESULT CALLED TO, READ BACK BY AND VERIFIED WITH: TO CMASEKO(RN) BY TCLEVELAND 09/05/2016 AT 6:28AM    Culture (A)  Final    STAPHYLOCOCCUS AUREUS SUSCEPTIBILITIES PERFORMED ON PREVIOUS CULTURE WITHIN THE LAST 5 DAYS.    Report Status 09/07/2016 FINAL  Final  Culture, blood (routine x 2)     Status: Abnormal   Collection Time: 09/04/16  2:25 PM  Result Value Ref Range Status   Specimen Description BLOOD RIGHT HAND  Final   Special Requests IN PEDIATRIC BOTTLE 2CC  Final   Culture  Setup Time   Final    GRAM POSITIVE COCCI IN CLUSTERS IN PEDIATRIC BOTTLE CRITICAL RESULT CALLED TO, READ BACK BY AND VERIFIED WITH: TO CMASEKO(RN) BY TCLEVELAND 09/05/2016 AT 6:28AM    Culture METHICILLIN RESISTANT STAPHYLOCOCCUS AUREUS (A)  Final   Report Status 09/07/2016 FINAL  Final   Organism ID, Bacteria METHICILLIN RESISTANT STAPHYLOCOCCUS AUREUS  Final      Susceptibility   Methicillin resistant staphylococcus aureus - MIC*    CIPROFLOXACIN >=8 RESISTANT Resistant     ERYTHROMYCIN <=0.25 SENSITIVE Sensitive     GENTAMICIN <=0.5 SENSITIVE Sensitive     OXACILLIN >=4 RESISTANT Resistant     TETRACYCLINE <=1 SENSITIVE Sensitive     VANCOMYCIN 1 SENSITIVE Sensitive     TRIMETH/SULFA <=10 SENSITIVE Sensitive     CLINDAMYCIN <=0.25 SENSITIVE Sensitive     RIFAMPIN <=0.5 SENSITIVE Sensitive     Inducible Clindamycin NEGATIVE Sensitive     * METHICILLIN RESISTANT STAPHYLOCOCCUS AUREUS  Blood Culture ID Panel (Reflexed)     Status: Abnormal   Collection Time: 09/04/16  2:25 PM  Result Value Ref Range Status   Enterococcus species NOT DETECTED NOT DETECTED Final    Vancomycin resistance NOT DETECTED NOT DETECTED Final   Listeria monocytogenes NOT DETECTED NOT DETECTED Final   Staphylococcus species DETECTED (A) NOT DETECTED Final    Comment: CRITICAL RESULT CALLED TO, READ BACK BY AND VERIFIED WITH: TO CMASEKO(RN) BY TCLEVELAND 09/05/2016 AT 6:28AM    Staphylococcus aureus DETECTED (A) NOT DETECTED Final    Comment: CRITICAL RESULT CALLED TO, READ BACK BY AND VERIFIED WITH: TO CMASEKO(RN) BY TCLEVELAND 09/05/2016 AT 6:28AM    Methicillin resistance DETECTED (A) NOT DETECTED Final    Comment: CRITICAL RESULT CALLED TO, READ BACK BY AND VERIFIED WITH: TO CMASEKO(RN) BY TCLEVELAND 09/05/2016 AT 6:28AM    Streptococcus species NOT DETECTED NOT DETECTED Final   Streptococcus agalactiae NOT DETECTED NOT DETECTED Final   Streptococcus pneumoniae NOT DETECTED NOT DETECTED Final   Streptococcus pyogenes NOT DETECTED NOT DETECTED Final   Acinetobacter baumannii NOT DETECTED NOT DETECTED Final   Enterobacteriaceae species NOT DETECTED NOT DETECTED Final   Enterobacter cloacae complex NOT DETECTED NOT DETECTED Final   Escherichia coli NOT DETECTED NOT DETECTED Final   Klebsiella oxytoca NOT DETECTED NOT DETECTED Final   Klebsiella pneumoniae NOT DETECTED NOT DETECTED Final   Proteus species NOT DETECTED NOT DETECTED Final   Serratia marcescens NOT DETECTED NOT DETECTED Final   Carbapenem resistance NOT DETECTED NOT DETECTED Final   Haemophilus influenzae NOT DETECTED NOT DETECTED Final   Neisseria meningitidis NOT DETECTED NOT DETECTED Final   Pseudomonas aeruginosa NOT DETECTED NOT DETECTED Final   Candida albicans NOT DETECTED NOT DETECTED Final   Candida glabrata NOT DETECTED NOT DETECTED Final   Candida krusei NOT DETECTED NOT DETECTED Final   Candida parapsilosis NOT DETECTED NOT DETECTED Final   Candida tropicalis NOT DETECTED NOT DETECTED Final  Culture, blood (routine x 2)     Status: Abnormal   Collection Time: 09/07/16  3:26 PM  Result  Value Ref Range Status   Specimen Description BLOOD RIGHT ARM  Final   Special Requests IN PEDIATRIC BOTTLE 2CC  Final   Culture  Setup Time   Final    GRAM POSITIVE COCCI IN CLUSTERS IN PEDIATRIC BOTTLE CRITICAL RESULT CALLED TO, READ BACK BY AND VERIFIED WITH: S. JEFFERSD, NURSE AT 0742 ON [id number] BY Lucienne Capers    Culture METHICILLIN RESISTANT STAPHYLOCOCCUS AUREUS (A)  Final   Report Status 09/10/2016 FINAL  Final   Organism ID, Bacteria METHICILLIN RESISTANT STAPHYLOCOCCUS AUREUS  Final      Susceptibility   Methicillin resistant staphylococcus aureus - MIC*    CIPROFLOXACIN >=8 RESISTANT Resistant     ERYTHROMYCIN <=0.25 SENSITIVE Sensitive     GENTAMICIN <=0.5 SENSITIVE Sensitive     OXACILLIN >=4 RESISTANT  Resistant     TETRACYCLINE <=1 SENSITIVE Sensitive     VANCOMYCIN 1 SENSITIVE Sensitive     TRIMETH/SULFA <=10 SENSITIVE Sensitive     CLINDAMYCIN <=0.25 SENSITIVE Sensitive     RIFAMPIN <=0.5 SENSITIVE Sensitive     Inducible Clindamycin NEGATIVE Sensitive     * METHICILLIN RESISTANT STAPHYLOCOCCUS AUREUS  Culture, blood (routine x 2)     Status: Abnormal   Collection Time: 09/07/16  3:33 PM  Result Value Ref Range Status   Specimen Description BLOOD RIGHT HAND  Final   Special Requests IN PEDIATRIC BOTTLE 2CC  Final   Culture  Setup Time   Final    GRAM POSITIVE COCCI IN CLUSTERS IN PEDIATRIC BOTTLE CRITICAL RESULT CALLED TO, READ BACK BY AND VERIFIED WITH: S. JEFFERS, NURSE AT 0742 ON [id number] BY Lucienne CapersS. YARBROUGH    Culture (A)  Final    STAPHYLOCOCCUS AUREUS SUSCEPTIBILITIES PERFORMED ON PREVIOUS CULTURE WITHIN THE LAST 5 DAYS.    Report Status 09/10/2016 FINAL  Final  Culture, blood (routine x 2)     Status: None   Collection Time: 09/10/16  3:30 PM  Result Value Ref Range Status   Specimen Description BLOOD RIGHT ARM  Final   Special Requests IN PEDIATRIC BOTTLE 1CC  Final   Culture NO GROWTH 5 DAYS  Final   Report Status 09/15/2016 FINAL  Final  Culture, blood  (routine x 2)     Status: Abnormal   Collection Time: 09/10/16  3:32 PM  Result Value Ref Range Status   Specimen Description BLOOD RIGHT ARM  Final   Special Requests IN PEDIATRIC BOTTLE .5CC  Final   Culture  Setup Time   Final    GRAM POSITIVE COCCI IN CLUSTERS IN PEDIATRIC BOTTLE CRITICAL RESULT CALLED TO, READ BACK BY AND VERIFIED WITH: M. PERRY, NURSE AT 1306 ON [id number] BY Lucienne CapersS. YARBROUGH    Culture (A)  Final    STAPHYLOCOCCUS AUREUS SUSCEPTIBILITIES PERFORMED ON PREVIOUS CULTURE WITHIN THE LAST 5 DAYS.    Report Status 09/12/2016 FINAL  Final  Culture, blood (routine x 2)     Status: None (Preliminary result)   Collection Time: 09/14/16 12:01 PM  Result Value Ref Range Status   Specimen Description BLOOD RIGHT HAND  Final   Special Requests IN PEDIATRIC BOTTLE 2CC  Final   Culture NO GROWTH 1 DAY  Final   Report Status PENDING  Incomplete  Culture, blood (routine x 2)     Status: None (Preliminary result)   Collection Time: 09/14/16 12:07 PM  Result Value Ref Range Status   Specimen Description BLOOD LEFT HAND  Final   Special Requests IN PEDIATRIC BOTTLE 2CC  Final   Culture NO GROWTH 1 DAY  Final   Report Status PENDING  Incomplete    Coagulation Studies: No results for input(s): LABPROT, INR in the last 72 hours.  Urinalysis: No results for input(s): COLORURINE, LABSPEC, PHURINE, GLUCOSEU, HGBUR, BILIRUBINUR, KETONESUR, PROTEINUR, UROBILINOGEN, NITRITE, LEUKOCYTESUR in the last 72 hours.  Invalid input(s): APPERANCEUR    Imaging: No results found.   Medications:    Aranesp 100 Midodrine 5 TID Phoslo 667   Assessment/ Plan:  65 y.o. male  with a PMHx of ESRD on dialysis for 9 years, hypertension, coronary artery disease, previous CVA, severe peripheral vascular disease, bilateral above-the-knee amputations, who was admitted to [facility] on 08/26/2016 for ongoing management of a very severe large sacral decubitus ulcer.   1.  ESRD on HD:  dialysis catheter  exchanged 09/08/16 for bacteremia and pt had subsequent removal catheter on 11/20 with replacement at a different site.   -  Blood cultures from 09/14/2016 are thus far negative.  Patient completed hemodialysis today. We will plan for dialysis again on Saturday.   2.  Anemia chronic kidney disease.  hemoglobin down a bit to 8.7. Hemoglobin was up to 9.9 yesterday. Continue Aranesp.  3.  Secondary hyperparathyroidism.  Phosphorus currently 3.0 and acceptable. Continue to monitor her bone mineral metabolism parameters.  4. Large sacral decubitus ulcer.  patient will need to be able to sitfor approximately 5-6 hours to be able to be transitioned to outpatient dialysis.   5.  Hypotension:  Blood pressure chronically low,  today blood pressure was 95/53. Continue the patient on midodrine.      LOS: 0 Insiya Oshea 11/22/20174:21 PM

## 2016-09-17 LAB — VANCOMYCIN, TROUGH: VANCOMYCIN TR: 20 ug/mL (ref 15–20)

## 2016-09-17 NOTE — Progress Notes (Signed)
Subjective:  Patient due for hemodialysis again tomorrow. He is resting comfortably in bed at the moment. No acute complaints at the moment.   Objective:  Vital signs in last 24 hours:  Temperature 97.3 pulse 90 respirations 18 blood pressure 113/70  Physical Exam: General: No acute distress, laying in the bed, obese  HEENT Anicteric, moist mucous membranes  Neck supple  Pulm/lungs normal breathing effort, clear to auscultation  CVS/Heart No rub or gallop, S1S2  Abdomen:  Soft, nontender, nondistended, diverting colostomy  Extremities: Bilateral above-the-knee amputation  Neurologic: Alert, able to follow simple commands  Skin: Wound VAC to the sacral decubitus ulcer  Access: Right IJ temporary dialysis catheter.        Basic Metabolic Panel:   Recent Labs Lab 09/11/16 0637 09/14/16 0801 09/15/16 0730  NA 134* 133* 137  K 4.6 4.0 4.1  CL 99* 96* 100*  CO2 23 23 28   GLUCOSE 86 96 92  BUN 45* 20 28*  CREATININE 8.32* 5.57* 7.37*  CALCIUM 8.1* 8.6* 8.6*  PHOS 3.3 2.7 3.0     CBC:  Recent Labs Lab 09/11/16 0637 09/14/16 0801 09/15/16 0730  WBC 10.2 12.1* 12.6*  HGB 8.7* 9.9* 8.7*  HCT 28.8* 32.9* 28.9*  MCV 100.3* 101.9* 101.0*  PLT 245 363 396      Microbiology:  Recent Results (from the past 720 hour(s))  Culture, blood (routine x 2)     Status: None   Collection Time: 08/26/16  9:45 PM  Result Value Ref Range Status   Specimen Description BLOOD RIGHT ANTECUBITAL  Final   Special Requests BOTTLES DRAWN AEROBIC AND ANAEROBIC 5CC  Final   Culture NO GROWTH 5 DAYS  Final   Report Status 08/31/2016 FINAL  Final  Culture, blood (routine x 2)     Status: None   Collection Time: 08/26/16  9:52 PM  Result Value Ref Range Status   Specimen Description BLOOD RIGHT HAND  Final   Special Requests IN PEDIATRIC BOTTLE 3CC  Final   Culture NO GROWTH 5 DAYS  Final   Report Status 08/31/2016 FINAL  Final  Culture, blood (routine x 2)     Status: Abnormal   Collection Time: 09/04/16  2:15 PM  Result Value Ref Range Status   Specimen Description BLOOD RIGHT HAND  Final   Special Requests IN PEDIATRIC BOTTLE 5CC  Final   Culture  Setup Time   Final    IN PEDIATRIC BOTTLE GRAM POSITIVE COCCI IN CLUSTERS CRITICAL RESULT CALLED TO, READ BACK BY AND VERIFIED WITH: TO CMASEKO(RN) BY TCLEVELAND 09/05/2016 AT 6:28AM    Culture (A)  Final    STAPHYLOCOCCUS AUREUS SUSCEPTIBILITIES PERFORMED ON PREVIOUS CULTURE WITHIN THE LAST 5 DAYS.    Report Status 09/07/2016 FINAL  Final  Culture, blood (routine x 2)     Status: Abnormal   Collection Time: 09/04/16  2:25 PM  Result Value Ref Range Status   Specimen Description BLOOD RIGHT HAND  Final   Special Requests IN PEDIATRIC BOTTLE 2CC  Final   Culture  Setup Time   Final    GRAM POSITIVE COCCI IN CLUSTERS IN PEDIATRIC BOTTLE CRITICAL RESULT CALLED TO, READ BACK BY AND VERIFIED WITH: TO CMASEKO(RN) BY TCLEVELAND 09/05/2016 AT 6:28AM    Culture METHICILLIN RESISTANT STAPHYLOCOCCUS AUREUS (A)  Final   Report Status 09/07/2016 FINAL  Final   Organism ID, Bacteria METHICILLIN RESISTANT STAPHYLOCOCCUS AUREUS  Final      Susceptibility   Methicillin resistant staphylococcus aureus -  MIC*    CIPROFLOXACIN >=8 RESISTANT Resistant     ERYTHROMYCIN <=0.25 SENSITIVE Sensitive     GENTAMICIN <=0.5 SENSITIVE Sensitive     OXACILLIN >=4 RESISTANT Resistant     TETRACYCLINE <=1 SENSITIVE Sensitive     VANCOMYCIN 1 SENSITIVE Sensitive     TRIMETH/SULFA <=10 SENSITIVE Sensitive     CLINDAMYCIN <=0.25 SENSITIVE Sensitive     RIFAMPIN <=0.5 SENSITIVE Sensitive     Inducible Clindamycin NEGATIVE Sensitive     * METHICILLIN RESISTANT STAPHYLOCOCCUS AUREUS  Blood Culture ID Panel (Reflexed)     Status: Abnormal   Collection Time: 09/04/16  2:25 PM  Result Value Ref Range Status   Enterococcus species NOT DETECTED NOT DETECTED Final   Vancomycin resistance NOT DETECTED NOT DETECTED Final   Listeria monocytogenes NOT  DETECTED NOT DETECTED Final   Staphylococcus species DETECTED (A) NOT DETECTED Final    Comment: CRITICAL RESULT CALLED TO, READ BACK BY AND VERIFIED WITH: TO CMASEKO(RN) BY TCLEVELAND 09/05/2016 AT 6:28AM    Staphylococcus aureus DETECTED (A) NOT DETECTED Final    Comment: CRITICAL RESULT CALLED TO, READ BACK BY AND VERIFIED WITH: TO CMASEKO(RN) BY TCLEVELAND 09/05/2016 AT 6:28AM    Methicillin resistance DETECTED (A) NOT DETECTED Final    Comment: CRITICAL RESULT CALLED TO, READ BACK BY AND VERIFIED WITH: TO CMASEKO(RN) BY TCLEVELAND 09/05/2016 AT 6:28AM    Streptococcus species NOT DETECTED NOT DETECTED Final   Streptococcus agalactiae NOT DETECTED NOT DETECTED Final   Streptococcus pneumoniae NOT DETECTED NOT DETECTED Final   Streptococcus pyogenes NOT DETECTED NOT DETECTED Final   Acinetobacter baumannii NOT DETECTED NOT DETECTED Final   Enterobacteriaceae species NOT DETECTED NOT DETECTED Final   Enterobacter cloacae complex NOT DETECTED NOT DETECTED Final   Escherichia coli NOT DETECTED NOT DETECTED Final   Klebsiella oxytoca NOT DETECTED NOT DETECTED Final   Klebsiella pneumoniae NOT DETECTED NOT DETECTED Final   Proteus species NOT DETECTED NOT DETECTED Final   Serratia marcescens NOT DETECTED NOT DETECTED Final   Carbapenem resistance NOT DETECTED NOT DETECTED Final   Haemophilus influenzae NOT DETECTED NOT DETECTED Final   Neisseria meningitidis NOT DETECTED NOT DETECTED Final   Pseudomonas aeruginosa NOT DETECTED NOT DETECTED Final   Candida albicans NOT DETECTED NOT DETECTED Final   Candida glabrata NOT DETECTED NOT DETECTED Final   Candida krusei NOT DETECTED NOT DETECTED Final   Candida parapsilosis NOT DETECTED NOT DETECTED Final   Candida tropicalis NOT DETECTED NOT DETECTED Final  Culture, blood (routine x 2)     Status: Abnormal   Collection Time: 09/07/16  3:26 PM  Result Value Ref Range Status   Specimen Description BLOOD RIGHT ARM  Final   Special Requests  IN PEDIATRIC BOTTLE 2CC  Final   Culture  Setup Time   Final    GRAM POSITIVE COCCI IN CLUSTERS IN PEDIATRIC BOTTLE CRITICAL RESULT CALLED TO, READ BACK BY AND VERIFIED WITH: S. JEFFERSD, NURSE AT 0742 ON [id number] BY Lucienne CapersS. YARBROUGH    Culture METHICILLIN RESISTANT STAPHYLOCOCCUS AUREUS (A)  Final   Report Status 09/10/2016 FINAL  Final   Organism ID, Bacteria METHICILLIN RESISTANT STAPHYLOCOCCUS AUREUS  Final      Susceptibility   Methicillin resistant staphylococcus aureus - MIC*    CIPROFLOXACIN >=8 RESISTANT Resistant     ERYTHROMYCIN <=0.25 SENSITIVE Sensitive     GENTAMICIN <=0.5 SENSITIVE Sensitive     OXACILLIN >=4 RESISTANT Resistant     TETRACYCLINE <=1 SENSITIVE Sensitive     VANCOMYCIN  1 SENSITIVE Sensitive     TRIMETH/SULFA <=10 SENSITIVE Sensitive     CLINDAMYCIN <=0.25 SENSITIVE Sensitive     RIFAMPIN <=0.5 SENSITIVE Sensitive     Inducible Clindamycin NEGATIVE Sensitive     * METHICILLIN RESISTANT STAPHYLOCOCCUS AUREUS  Culture, blood (routine x 2)     Status: Abnormal   Collection Time: 09/07/16  3:33 PM  Result Value Ref Range Status   Specimen Description BLOOD RIGHT HAND  Final   Special Requests IN PEDIATRIC BOTTLE 2CC  Final   Culture  Setup Time   Final    GRAM POSITIVE COCCI IN CLUSTERS IN PEDIATRIC BOTTLE CRITICAL RESULT CALLED TO, READ BACK BY AND VERIFIED WITH: S. JEFFERS, NURSE AT 0742 ON [id number] BY Lucienne CapersS. YARBROUGH    Culture (A)  Final    STAPHYLOCOCCUS AUREUS SUSCEPTIBILITIES PERFORMED ON PREVIOUS CULTURE WITHIN THE LAST 5 DAYS.    Report Status 09/10/2016 FINAL  Final  Culture, blood (routine x 2)     Status: None   Collection Time: 09/10/16  3:30 PM  Result Value Ref Range Status   Specimen Description BLOOD RIGHT ARM  Final   Special Requests IN PEDIATRIC BOTTLE 1CC  Final   Culture NO GROWTH 5 DAYS  Final   Report Status 09/15/2016 FINAL  Final  Culture, blood (routine x 2)     Status: Abnormal   Collection Time: 09/10/16  3:32 PM  Result Value  Ref Range Status   Specimen Description BLOOD RIGHT ARM  Final   Special Requests IN PEDIATRIC BOTTLE .5CC  Final   Culture  Setup Time   Final    GRAM POSITIVE COCCI IN CLUSTERS IN PEDIATRIC BOTTLE CRITICAL RESULT CALLED TO, READ BACK BY AND VERIFIED WITH: M. PERRY, NURSE AT 1306 ON [id number] BY Lucienne CapersS. YARBROUGH    Culture (A)  Final    STAPHYLOCOCCUS AUREUS SUSCEPTIBILITIES PERFORMED ON PREVIOUS CULTURE WITHIN THE LAST 5 DAYS.    Report Status 09/12/2016 FINAL  Final  Culture, blood (routine x 2)     Status: None (Preliminary result)   Collection Time: 09/14/16 12:01 PM  Result Value Ref Range Status   Specimen Description BLOOD RIGHT HAND  Final   Special Requests IN PEDIATRIC BOTTLE 2CC  Final   Culture NO GROWTH 2 DAYS  Final   Report Status PENDING  Incomplete  Culture, blood (routine x 2)     Status: None (Preliminary result)   Collection Time: 09/14/16 12:07 PM  Result Value Ref Range Status   Specimen Description BLOOD LEFT HAND  Final   Special Requests IN PEDIATRIC BOTTLE 2CC  Final   Culture NO GROWTH 2 DAYS  Final   Report Status PENDING  Incomplete    Coagulation Studies: No results for input(s): LABPROT, INR in the last 72 hours.  Urinalysis: No results for input(s): COLORURINE, LABSPEC, PHURINE, GLUCOSEU, HGBUR, BILIRUBINUR, KETONESUR, PROTEINUR, UROBILINOGEN, NITRITE, LEUKOCYTESUR in the last 72 hours.  Invalid input(s): APPERANCEUR    Imaging: No results found.   Medications:    Aranesp 100 Midodrine 5 TID Phoslo 667   Assessment/ Plan:  65 y.o. male  with a PMHx of ESRD on dialysis for 9 years, hypertension, coronary artery disease, previous CVA, severe peripheral vascular disease, bilateral above-the-knee amputations, who was admitted to [facility] on 08/26/2016 for ongoing management of a very severe large sacral decubitus ulcer.   1.  ESRD on HD:  dialysis catheter exchanged 09/08/16 for bacteremia and pt had subsequent removal catheter on 11/20 with  replacement  at a different site.   -  Patient due for hemodialysis again tomorrow. We will prepare her orders. She will be back to PT as scheduled.   2.  Anemia chronic kidney disease. Hemoglobin currently 8.7. Continue to monitor and continue Aranesp.  3.  Secondary hyperparathyroidism.  recheck phosphorus tomorrow. Most recent phosphorus was 3.0 and under good control.  4. Large sacral decubitus ulcer.  patient will need to be able to sitfor approximately 5-6 hours to be able to be transitioned to outpatient dialysis.   5.  Hypotension: blood pressure better this morning and 113/70. Continue midodrine.      LOS: 0 Tyrique Sporn 11/24/201711:53 AM

## 2016-09-18 LAB — RENAL FUNCTION PANEL
ALBUMIN: 3 g/dL — AB (ref 3.5–5.0)
Anion gap: 13 (ref 5–15)
BUN: 28 mg/dL — AB (ref 6–20)
CHLORIDE: 102 mmol/L (ref 101–111)
CO2: 23 mmol/L (ref 22–32)
Calcium: 9.2 mg/dL (ref 8.9–10.3)
Creatinine, Ser: 9.15 mg/dL — ABNORMAL HIGH (ref 0.61–1.24)
GFR calc Af Amer: 6 mL/min — ABNORMAL LOW (ref >60)
GFR calc non Af Amer: 5 mL/min — ABNORMAL LOW (ref >60)
GLUCOSE: 97 mg/dL (ref 65–99)
POTASSIUM: 4.7 mmol/L (ref 3.5–5.1)
Phosphorus: 2.9 mg/dL (ref 2.5–4.6)
Sodium: 138 mmol/L (ref 135–145)

## 2016-09-18 LAB — CBC
HEMATOCRIT: 34.9 % — AB (ref 39.0–52.0)
Hemoglobin: 10.3 g/dL — ABNORMAL LOW (ref 13.0–17.0)
MCH: 30.7 pg (ref 26.0–34.0)
MCHC: 29.5 g/dL — AB (ref 30.0–36.0)
MCV: 103.9 fL — AB (ref 78.0–100.0)
Platelets: 285 10*3/uL (ref 150–400)
RBC: 3.36 MIL/uL — ABNORMAL LOW (ref 4.22–5.81)
RDW: 18 % — AB (ref 11.5–15.5)
WBC: 13.6 10*3/uL — ABNORMAL HIGH (ref 4.0–10.5)

## 2016-09-19 LAB — CULTURE, BLOOD (ROUTINE X 2)
CULTURE: NO GROWTH
CULTURE: NO GROWTH

## 2016-09-19 LAB — PTH, INTACT AND CALCIUM
Calcium, Total (PTH): 9 mg/dL (ref 8.6–10.2)
PTH: 77 pg/mL — ABNORMAL HIGH (ref 15–65)

## 2016-09-20 DIAGNOSIS — N186 End stage renal disease: Secondary | ICD-10-CM | POA: Diagnosis not present

## 2016-09-20 NOTE — Consult Note (Signed)
[facility] [place]  Patient name: Christopher Walls MRN: [id number] DOB: 1951-09-15 Sex: male  REASON FOR CONSULT: permanent dialysis access, consult is from Dr. Burnis Medinhumli  HPI: Christopher RomeKenneth Dise is a 65 y.o. male, who presents for evaluation for permanent dialysis access. He is currently dialyzing via a right temporary right IJ catheter. He had a tunneled HD cath that was removed secondary to infection. Previously dialyzing via a left upper arm fistula that has now clotted. 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  [facility] of a large sacral decubitus ulcer. This issue started after he had a right above-knee amputation. He has a left above-knee amputation as well.  No past medical history on file.  No family history on file.  SOCIAL HISTORY: Social History   Social History  . Marital status: Single    Spouse name: N/A  . Number of children: N/A  . Years of education: N/A   Occupational History  . Not on file.   Social History Main Topics  . Smoking status: Not on file  . Smokeless tobacco: Not on file  . Alcohol use Not on file  . Drug use: Unknown  . Sexual activity: Not on file   Other Topics Concern  . Not on file   Social History Narrative  . No narrative on file    Allergies not on file  Current Facility-Administered Medications  Medication Dose Route Frequency Provider Last Rate Last Dose  . lidocaine (XYLOCAINE) 1 % (with pres) injection    PRN Irish LackGlenn Yamagata, MD   10 mL at 09/13/16 1422    REVIEW OF SYSTEMS:  [X]  denotes positive finding, [ ]  denotes negative finding Cardiac  Comments:  Chest pain or chest pressure:    Shortness of breath upon exertion:    Short of breath when lying flat:    Irregular heart rhythm:        Vascular    Pain in calf, thigh, or hip brought on by ambulation:    Pain in feet at night that wakes you up from your sleep:     Blood clot in your  veins:    Leg swelling:         Pulmonary    Oxygen at home:    Productive cough:     Wheezing:         Neurologic    Sudden weakness in arms or legs:     Sudden numbness in arms or legs:     Sudden onset of difficulty speaking or slurred speech:    Temporary loss of vision in one eye:     Problems with dizziness:         Gastrointestinal    Blood in stool:     Vomited blood:         Genitourinary    Burning when urinating:     Blood in urine:        Psychiatric    Major depression:         Hematologic    Bleeding problems:    Problems with blood clotting too easily:        Skin    Rashes or ulcers:        Constitutional    Fever or chills:      PHYSICAL EXAM: There were no vitals filed for this visit.  GENERAL: The patient Is alert and oriented male, in no acute  distress. The vital signs are documented above. CARDIAC: There is a regular rate and rhythm.  VASCULAR: 2+ brachial pulses bilaterally. Nonpalpable radial pulses. Non-functioning fistulas bilateral upper arms. PULMONARY: Breathing nonlabored.  MUSCULOSKELETAL: Bilateral amputee. NEUROLOGIC: No focal deficits. PSYCHIATRIC: The patient has a normal affect.  MEDICAL ISSUES: ESRD on HD  We will obtain vein mapping to assess basilic veins for possible fistula. Otherwise, he'll need left arm graft placement. His infectious issues resolved prior to graft placement. He is currently dialyzing via a right IJ temporary catheter. This will need to be converted to a tunneled dialysis catheter. We will follow up after vein mapping.  Maris BergerKimberly Trinh, PA-C [facility] [place] [phone]   I have examined the patient, reviewed and agree with above.  Gretta BeganEarly, Jamarion [place], MD 09/20/2016 3:30 PM

## 2016-09-20 NOTE — Progress Notes (Signed)
Subjective:  Patient due for hemodialysis again tomorrow. He is resting comfortably in bed at the moment. No acute complaints at the moment.   Objective:  Vital signs in last 24 hours:  Temperature  97.4, pulse 98, respirations 20, blood pressure 158/58  Physical Exam: General: No acute distress, laying in the bed, obese  HEENT Anicteric, moist mucous membranes  Neck supple  Pulm/lungs normal breathing effort, clear to auscultation  CVS/Heart No rub or gallop, S1S2  Abdomen:  Soft, nontender, nondistended, diverting colostomy  Extremities: Bilateral above-the-knee amputation  Neurologic: Alert, able to follow simple commands  Skin: Wound VAC to the sacral decubitus ulcer  Access: Right IJ temporary dialysis catheter.        Basic Metabolic Panel:   Recent Labs Lab 09/14/16 0801 09/15/16 0730 09/18/16 0652  NA 133* 137 138  K 4.0 4.1 4.7  CL 96* 100* 102  CO2 23 28 23   GLUCOSE 96 92 97  BUN 20 28* 28*  CREATININE 5.57* 7.37* 9.15*  CALCIUM 8.6* 8.6* 9.2  9.0  PHOS 2.7 3.0 2.9     CBC:  Recent Labs Lab 09/14/16 0801 09/15/16 0730 09/18/16 0652  WBC 12.1* 12.6* 13.6*  HGB 9.9* 8.7* 10.3*  HCT 32.9* 28.9* 34.9*  MCV 101.9* 101.0* 103.9*  PLT 363 396 285      Microbiology:  Recent Results (from the past 720 hour(s))  Culture, blood (routine x 2)     Status: None   Collection Time: 08/26/16  9:45 PM  Result Value Ref Range Status   Specimen Description BLOOD RIGHT ANTECUBITAL  Final   Special Requests BOTTLES DRAWN AEROBIC AND ANAEROBIC 5CC  Final   Culture NO GROWTH 5 DAYS  Final   Report Status 08/31/2016 FINAL  Final  Culture, blood (routine x 2)     Status: None   Collection Time: 08/26/16  9:52 PM  Result Value Ref Range Status   Specimen Description BLOOD RIGHT HAND  Final   Special Requests IN PEDIATRIC BOTTLE 3CC  Final   Culture NO GROWTH 5 DAYS  Final   Report Status 08/31/2016 FINAL  Final  Culture, blood (routine x 2)     Status:  Abnormal   Collection Time: 09/04/16  2:15 PM  Result Value Ref Range Status   Specimen Description BLOOD RIGHT HAND  Final   Special Requests IN PEDIATRIC BOTTLE 5CC  Final   Culture  Setup Time   Final    IN PEDIATRIC BOTTLE GRAM POSITIVE COCCI IN CLUSTERS CRITICAL RESULT CALLED TO, READ BACK BY AND VERIFIED WITH: TO CMASEKO(RN) BY TCLEVELAND 09/05/2016 AT 6:28AM    Culture (A)  Final    STAPHYLOCOCCUS AUREUS SUSCEPTIBILITIES PERFORMED ON PREVIOUS CULTURE WITHIN THE LAST 5 DAYS.    Report Status 09/07/2016 FINAL  Final  Culture, blood (routine x 2)     Status: Abnormal   Collection Time: 09/04/16  2:25 PM  Result Value Ref Range Status   Specimen Description BLOOD RIGHT HAND  Final   Special Requests IN PEDIATRIC BOTTLE 2CC  Final   Culture  Setup Time   Final    GRAM POSITIVE COCCI IN CLUSTERS IN PEDIATRIC BOTTLE CRITICAL RESULT CALLED TO, READ BACK BY AND VERIFIED WITH: TO CMASEKO(RN) BY TCLEVELAND 09/05/2016 AT 6:28AM    Culture METHICILLIN RESISTANT STAPHYLOCOCCUS AUREUS (A)  Final   Report Status 09/07/2016 FINAL  Final   Organism ID, Bacteria METHICILLIN RESISTANT STAPHYLOCOCCUS AUREUS  Final      Susceptibility   Methicillin  resistant staphylococcus aureus - MIC*    CIPROFLOXACIN >=8 RESISTANT Resistant     ERYTHROMYCIN <=0.25 SENSITIVE Sensitive     GENTAMICIN <=0.5 SENSITIVE Sensitive     OXACILLIN >=4 RESISTANT Resistant     TETRACYCLINE <=1 SENSITIVE Sensitive     VANCOMYCIN 1 SENSITIVE Sensitive     TRIMETH/SULFA <=10 SENSITIVE Sensitive     CLINDAMYCIN <=0.25 SENSITIVE Sensitive     RIFAMPIN <=0.5 SENSITIVE Sensitive     Inducible Clindamycin NEGATIVE Sensitive     * METHICILLIN RESISTANT STAPHYLOCOCCUS AUREUS  Blood Culture ID Panel (Reflexed)     Status: Abnormal   Collection Time: 09/04/16  2:25 PM  Result Value Ref Range Status   Enterococcus species NOT DETECTED NOT DETECTED Final   Vancomycin resistance NOT DETECTED NOT DETECTED Final   Listeria  monocytogenes NOT DETECTED NOT DETECTED Final   Staphylococcus species DETECTED (A) NOT DETECTED Final    Comment: CRITICAL RESULT CALLED TO, READ BACK BY AND VERIFIED WITH: TO CMASEKO(RN) BY TCLEVELAND 09/05/2016 AT 6:28AM    Staphylococcus aureus DETECTED (A) NOT DETECTED Final    Comment: CRITICAL RESULT CALLED TO, READ BACK BY AND VERIFIED WITH: TO CMASEKO(RN) BY TCLEVELAND 09/05/2016 AT 6:28AM    Methicillin resistance DETECTED (A) NOT DETECTED Final    Comment: CRITICAL RESULT CALLED TO, READ BACK BY AND VERIFIED WITH: TO CMASEKO(RN) BY TCLEVELAND 09/05/2016 AT 6:28AM    Streptococcus species NOT DETECTED NOT DETECTED Final   Streptococcus agalactiae NOT DETECTED NOT DETECTED Final   Streptococcus pneumoniae NOT DETECTED NOT DETECTED Final   Streptococcus pyogenes NOT DETECTED NOT DETECTED Final   Acinetobacter baumannii NOT DETECTED NOT DETECTED Final   Enterobacteriaceae species NOT DETECTED NOT DETECTED Final   Enterobacter cloacae complex NOT DETECTED NOT DETECTED Final   Escherichia coli NOT DETECTED NOT DETECTED Final   Klebsiella oxytoca NOT DETECTED NOT DETECTED Final   Klebsiella pneumoniae NOT DETECTED NOT DETECTED Final   Proteus species NOT DETECTED NOT DETECTED Final   Serratia marcescens NOT DETECTED NOT DETECTED Final   Carbapenem resistance NOT DETECTED NOT DETECTED Final   Haemophilus influenzae NOT DETECTED NOT DETECTED Final   Neisseria meningitidis NOT DETECTED NOT DETECTED Final   Pseudomonas aeruginosa NOT DETECTED NOT DETECTED Final   Candida albicans NOT DETECTED NOT DETECTED Final   Candida glabrata NOT DETECTED NOT DETECTED Final   Candida krusei NOT DETECTED NOT DETECTED Final   Candida parapsilosis NOT DETECTED NOT DETECTED Final   Candida tropicalis NOT DETECTED NOT DETECTED Final  Culture, blood (routine x 2)     Status: Abnormal   Collection Time: 09/07/16  3:26 PM  Result Value Ref Range Status   Specimen Description BLOOD RIGHT ARM  Final    Special Requests IN PEDIATRIC BOTTLE 2CC  Final   Culture  Setup Time   Final    GRAM POSITIVE COCCI IN CLUSTERS IN PEDIATRIC BOTTLE CRITICAL RESULT CALLED TO, READ BACK BY AND VERIFIED WITH: S. JEFFERSD, NURSE AT 0742 ON [id number] BY Lucienne Capers    Culture METHICILLIN RESISTANT STAPHYLOCOCCUS AUREUS (A)  Final   Report Status 09/10/2016 FINAL  Final   Organism ID, Bacteria METHICILLIN RESISTANT STAPHYLOCOCCUS AUREUS  Final      Susceptibility   Methicillin resistant staphylococcus aureus - MIC*    CIPROFLOXACIN >=8 RESISTANT Resistant     ERYTHROMYCIN <=0.25 SENSITIVE Sensitive     GENTAMICIN <=0.5 SENSITIVE Sensitive     OXACILLIN >=4 RESISTANT Resistant     TETRACYCLINE <=1 SENSITIVE Sensitive  VANCOMYCIN 1 SENSITIVE Sensitive     TRIMETH/SULFA <=10 SENSITIVE Sensitive     CLINDAMYCIN <=0.25 SENSITIVE Sensitive     RIFAMPIN <=0.5 SENSITIVE Sensitive     Inducible Clindamycin NEGATIVE Sensitive     * METHICILLIN RESISTANT STAPHYLOCOCCUS AUREUS  Culture, blood (routine x 2)     Status: Abnormal   Collection Time: 09/07/16  3:33 PM  Result Value Ref Range Status   Specimen Description BLOOD RIGHT HAND  Final   Special Requests IN PEDIATRIC BOTTLE 2CC  Final   Culture  Setup Time   Final    GRAM POSITIVE COCCI IN CLUSTERS IN PEDIATRIC BOTTLE CRITICAL RESULT CALLED TO, READ BACK BY AND VERIFIED WITH: S. JEFFERS, NURSE AT 0742 ON [id number] BY Lucienne CapersS. YARBROUGH    Culture (A)  Final    STAPHYLOCOCCUS AUREUS SUSCEPTIBILITIES PERFORMED ON PREVIOUS CULTURE WITHIN THE LAST 5 DAYS.    Report Status 09/10/2016 FINAL  Final  Culture, blood (routine x 2)     Status: None   Collection Time: 09/10/16  3:30 PM  Result Value Ref Range Status   Specimen Description BLOOD RIGHT ARM  Final   Special Requests IN PEDIATRIC BOTTLE 1CC  Final   Culture NO GROWTH 5 DAYS  Final   Report Status 09/15/2016 FINAL  Final  Culture, blood (routine x 2)     Status: Abnormal   Collection Time: 09/10/16  3:32 PM   Result Value Ref Range Status   Specimen Description BLOOD RIGHT ARM  Final   Special Requests IN PEDIATRIC BOTTLE .5CC  Final   Culture  Setup Time   Final    GRAM POSITIVE COCCI IN CLUSTERS IN PEDIATRIC BOTTLE CRITICAL RESULT CALLED TO, READ BACK BY AND VERIFIED WITH: M. PERRY, NURSE AT 1306 ON [id number] BY Lucienne CapersS. YARBROUGH    Culture (A)  Final    STAPHYLOCOCCUS AUREUS SUSCEPTIBILITIES PERFORMED ON PREVIOUS CULTURE WITHIN THE LAST 5 DAYS.    Report Status 09/12/2016 FINAL  Final  Culture, blood (routine x 2)     Status: None   Collection Time: 09/14/16 12:01 PM  Result Value Ref Range Status   Specimen Description BLOOD RIGHT HAND  Final   Special Requests IN PEDIATRIC BOTTLE 2CC  Final   Culture NO GROWTH 5 DAYS  Final   Report Status 09/19/2016 FINAL  Final  Culture, blood (routine x 2)     Status: None   Collection Time: 09/14/16 12:07 PM  Result Value Ref Range Status   Specimen Description BLOOD LEFT HAND  Final   Special Requests IN PEDIATRIC BOTTLE 2CC  Final   Culture NO GROWTH 5 DAYS  Final   Report Status 09/19/2016 FINAL  Final    Coagulation Studies: No results for input(s): LABPROT, INR in the last 72 hours.  Urinalysis: No results for input(s): COLORURINE, LABSPEC, PHURINE, GLUCOSEU, HGBUR, BILIRUBINUR, KETONESUR, PROTEINUR, UROBILINOGEN, NITRITE, LEUKOCYTESUR in the last 72 hours.  Invalid input(s): APPERANCEUR    Imaging: No results found.   Medications:    Aranesp 100 Midodrine 10 BID Phoslo 667 tid sensipar 30   Assessment/ Plan:  65 y.o. male  with a PMHx of ESRD on dialysis for 9 years, hypertension, coronary artery disease, previous CVA, severe peripheral vascular disease, bilateral above-the-knee amputations, who was admitted to [facility] on 08/26/2016 for ongoing management of a very severe large sacral decubitus ulcer.   1.  ESRD on HD:  dialysis catheter exchanged 09/08/16 for bacteremia and pt had subsequent removal catheter on 11/20 with  replacement at a different site.   -  Patient due for hemodialysis again tomorrow. We will prepare orders.  - continue TTS Schedule  - vascular surgery evaluation in progress for permanent access  2.  Anemia chronic kidney disease. Hemoglobin currently 10.3. Continue to monitor and continue Aranesp.  3.  Secondary hyperparathyroidism.  Most recent phosphorus was 2.9 and under good control.  4. Large sacral decubitus ulcer.  patient will need to be able to sitfor approximately 5-6 hours to be able to be transitioned to outpatient dialysis.   5.  Hypotension:  -Continue midodrine.      LOS: 0 Delphia Kaylor 11/27/20173:57 PM

## 2016-09-21 LAB — CBC
HEMATOCRIT: 32.7 % — AB (ref 39.0–52.0)
HEMOGLOBIN: 9.9 g/dL — AB (ref 13.0–17.0)
MCH: 30.6 pg (ref 26.0–34.0)
MCHC: 30.3 g/dL (ref 30.0–36.0)
MCV: 100.9 fL — ABNORMAL HIGH (ref 78.0–100.0)
Platelets: 299 10*3/uL (ref 150–400)
RBC: 3.24 MIL/uL — ABNORMAL LOW (ref 4.22–5.81)
RDW: 16.8 % — AB (ref 11.5–15.5)
WBC: 13.6 10*3/uL — ABNORMAL HIGH (ref 4.0–10.5)

## 2016-09-21 LAB — RENAL FUNCTION PANEL
ANION GAP: 15 (ref 5–15)
Albumin: 2.6 g/dL — ABNORMAL LOW (ref 3.5–5.0)
BUN: 30 mg/dL — AB (ref 6–20)
CHLORIDE: 98 mmol/L — AB (ref 101–111)
CO2: 23 mmol/L (ref 22–32)
Calcium: 9.2 mg/dL (ref 8.9–10.3)
Creatinine, Ser: 10.07 mg/dL — ABNORMAL HIGH (ref 0.61–1.24)
GFR calc Af Amer: 5 mL/min — ABNORMAL LOW (ref >60)
GFR calc non Af Amer: 5 mL/min — ABNORMAL LOW (ref >60)
GLUCOSE: 93 mg/dL (ref 65–99)
POTASSIUM: 4.7 mmol/L (ref 3.5–5.1)
Phosphorus: 3.8 mg/dL (ref 2.5–4.6)
Sodium: 136 mmol/L (ref 135–145)

## 2016-09-22 ENCOUNTER — Other Ambulatory Visit (HOSPITAL_COMMUNITY): Payer: Medicare Other

## 2016-09-22 ENCOUNTER — Inpatient Hospital Stay (HOSPITAL_BASED_OUTPATIENT_CLINIC_OR_DEPARTMENT_OTHER)
Admission: RE | Admit: 2016-09-22 | Discharge: 2016-09-22 | Disposition: A | Discharge status: Home / Self Care | Payer: Medicare Other | Source: Other Acute Inpatient Hospital | Attending: Vascular Surgery | Admitting: Vascular Surgery

## 2016-09-22 ENCOUNTER — Encounter (HOSPITAL_COMMUNITY): Payer: Self-pay | Admitting: Interventional Radiology

## 2016-09-22 DIAGNOSIS — Z01810 Encounter for preprocedural cardiovascular examination: Secondary | ICD-10-CM

## 2016-09-22 HISTORY — PX: IR GENERIC HISTORICAL: IMG1180011

## 2016-09-22 MED ORDER — HEPARIN SODIUM (PORCINE) 1000 UNIT/ML IJ SOLN
INTRAMUSCULAR | Status: AC
  Filled 2016-09-22: qty 1

## 2016-09-22 MED ORDER — LIDOCAINE HCL 1 % IJ SOLN
INTRAMUSCULAR | Status: AC
  Administered 2016-09-22: 7 mL via SUBCUTANEOUS
  Filled 2016-09-22: qty 20

## 2016-09-22 NOTE — Progress Notes (Signed)
Subjective:  Patient due for hemodialysis again tomorrow. He is resting comfortably in bed at the moment. No acute complaints at the moment. Patient pulled out his dialysis cathter because "it was uncomfortable"  Objective:  Vital signs in last 24 hours:  Temperature  97.4, pulse 98, respirations 8, blood pressure 103/71  Physical Exam: General: No acute distress, laying in the bed, obese  HEENT Anicteric, moist mucous membranes  Neck supple  Pulm/lungs normal breathing effort, clear to auscultation  CVS/Heart No rub or gallop, S1S2  Abdomen:  Soft, nontender, nondistended, diverting colostomy  Extremities: Bilateral above-the-knee amputation  Neurologic: Alert, able to follow simple commands  Skin: Wound VAC to the sacral decubitus ulcer  Access:         Basic Metabolic Panel:   Recent Labs Lab 09/18/16 0652 09/21/16 0536  NA 138 136  K 4.7 4.7  CL 102 98*  CO2 23 23  GLUCOSE 97 93  BUN 28* 30*  CREATININE 9.15* 10.07*  CALCIUM 9.2  9.0 9.2  PHOS 2.9 3.8     CBC:  Recent Labs Lab 09/18/16 0652 09/21/16 0536  WBC 13.6* 13.6*  HGB 10.3* 9.9*  HCT 34.9* 32.7*  MCV 103.9* 100.9*  PLT 285 299      Microbiology:  Recent Results (from the past 720 hour(s))  Culture, blood (routine x 2)     Status: None   Collection Time: 08/26/16  9:45 PM  Result Value Ref Range Status   Specimen Description BLOOD RIGHT ANTECUBITAL  Final   Special Requests BOTTLES DRAWN AEROBIC AND ANAEROBIC 5CC  Final   Culture NO GROWTH 5 DAYS  Final   Report Status 08/31/2016 FINAL  Final  Culture, blood (routine x 2)     Status: None   Collection Time: 08/26/16  9:52 PM  Result Value Ref Range Status   Specimen Description BLOOD RIGHT HAND  Final   Special Requests IN PEDIATRIC BOTTLE 3CC  Final   Culture NO GROWTH 5 DAYS  Final   Report Status 08/31/2016 FINAL  Final  Culture, blood (routine x 2)     Status: Abnormal   Collection Time: 09/04/16  2:15 PM  Result Value Ref  Range Status   Specimen Description BLOOD RIGHT HAND  Final   Special Requests IN PEDIATRIC BOTTLE 5CC  Final   Culture  Setup Time   Final    IN PEDIATRIC BOTTLE GRAM POSITIVE COCCI IN CLUSTERS CRITICAL RESULT CALLED TO, READ BACK BY AND VERIFIED WITH: TO CMASEKO(RN) BY TCLEVELAND 09/05/2016 AT 6:28AM    Culture (A)  Final    STAPHYLOCOCCUS AUREUS SUSCEPTIBILITIES PERFORMED ON PREVIOUS CULTURE WITHIN THE LAST 5 DAYS.    Report Status 09/07/2016 FINAL  Final  Culture, blood (routine x 2)     Status: Abnormal   Collection Time: 09/04/16  2:25 PM  Result Value Ref Range Status   Specimen Description BLOOD RIGHT HAND  Final   Special Requests IN PEDIATRIC BOTTLE 2CC  Final   Culture  Setup Time   Final    GRAM POSITIVE COCCI IN CLUSTERS IN PEDIATRIC BOTTLE CRITICAL RESULT CALLED TO, READ BACK BY AND VERIFIED WITH: TO CMASEKO(RN) BY TCLEVELAND 09/05/2016 AT 6:28AM    Culture METHICILLIN RESISTANT STAPHYLOCOCCUS AUREUS (A)  Final   Report Status 09/07/2016 FINAL  Final   Organism ID, Bacteria METHICILLIN RESISTANT STAPHYLOCOCCUS AUREUS  Final      Susceptibility   Methicillin resistant staphylococcus aureus - MIC*    CIPROFLOXACIN >=8 RESISTANT Resistant  ERYTHROMYCIN <=0.25 SENSITIVE Sensitive     GENTAMICIN <=0.5 SENSITIVE Sensitive     OXACILLIN >=4 RESISTANT Resistant     TETRACYCLINE <=1 SENSITIVE Sensitive     VANCOMYCIN 1 SENSITIVE Sensitive     TRIMETH/SULFA <=10 SENSITIVE Sensitive     CLINDAMYCIN <=0.25 SENSITIVE Sensitive     RIFAMPIN <=0.5 SENSITIVE Sensitive     Inducible Clindamycin NEGATIVE Sensitive     * METHICILLIN RESISTANT STAPHYLOCOCCUS AUREUS  Blood Culture ID Panel (Reflexed)     Status: Abnormal   Collection Time: 09/04/16  2:25 PM  Result Value Ref Range Status   Enterococcus species NOT DETECTED NOT DETECTED Final   Vancomycin resistance NOT DETECTED NOT DETECTED Final   Listeria monocytogenes NOT DETECTED NOT DETECTED Final   Staphylococcus species  DETECTED (A) NOT DETECTED Final    Comment: CRITICAL RESULT CALLED TO, READ BACK BY AND VERIFIED WITH: TO CMASEKO(RN) BY TCLEVELAND 09/05/2016 AT 6:28AM    Staphylococcus aureus DETECTED (A) NOT DETECTED Final    Comment: CRITICAL RESULT CALLED TO, READ BACK BY AND VERIFIED WITH: TO CMASEKO(RN) BY TCLEVELAND 09/05/2016 AT 6:28AM    Methicillin resistance DETECTED (A) NOT DETECTED Final    Comment: CRITICAL RESULT CALLED TO, READ BACK BY AND VERIFIED WITH: TO CMASEKO(RN) BY TCLEVELAND 09/05/2016 AT 6:28AM    Streptococcus species NOT DETECTED NOT DETECTED Final   Streptococcus agalactiae NOT DETECTED NOT DETECTED Final   Streptococcus pneumoniae NOT DETECTED NOT DETECTED Final   Streptococcus pyogenes NOT DETECTED NOT DETECTED Final   Acinetobacter baumannii NOT DETECTED NOT DETECTED Final   Enterobacteriaceae species NOT DETECTED NOT DETECTED Final   Enterobacter cloacae complex NOT DETECTED NOT DETECTED Final   Escherichia coli NOT DETECTED NOT DETECTED Final   Klebsiella oxytoca NOT DETECTED NOT DETECTED Final   Klebsiella pneumoniae NOT DETECTED NOT DETECTED Final   Proteus species NOT DETECTED NOT DETECTED Final   Serratia marcescens NOT DETECTED NOT DETECTED Final   Carbapenem resistance NOT DETECTED NOT DETECTED Final   Haemophilus influenzae NOT DETECTED NOT DETECTED Final   Neisseria meningitidis NOT DETECTED NOT DETECTED Final   Pseudomonas aeruginosa NOT DETECTED NOT DETECTED Final   Candida albicans NOT DETECTED NOT DETECTED Final   Candida glabrata NOT DETECTED NOT DETECTED Final   Candida krusei NOT DETECTED NOT DETECTED Final   Candida parapsilosis NOT DETECTED NOT DETECTED Final   Candida tropicalis NOT DETECTED NOT DETECTED Final  Culture, blood (routine x 2)     Status: Abnormal   Collection Time: 09/07/16  3:26 PM  Result Value Ref Range Status   Specimen Description BLOOD RIGHT ARM  Final   Special Requests IN PEDIATRIC BOTTLE 2CC  Final   Culture  Setup Time    Final    GRAM POSITIVE COCCI IN CLUSTERS IN PEDIATRIC BOTTLE CRITICAL RESULT CALLED TO, READ BACK BY AND VERIFIED WITH: S. JEFFERSD, NURSE AT 0742 ON [phone] BY Lucienne CapersS. YARBROUGH    Culture METHICILLIN RESISTANT STAPHYLOCOCCUS AUREUS (A)  Final   Report Status 09/10/2016 FINAL  Final   Organism ID, Bacteria METHICILLIN RESISTANT STAPHYLOCOCCUS AUREUS  Final      Susceptibility   Methicillin resistant staphylococcus aureus - MIC*    CIPROFLOXACIN >=8 RESISTANT Resistant     ERYTHROMYCIN <=0.25 SENSITIVE Sensitive     GENTAMICIN <=0.5 SENSITIVE Sensitive     OXACILLIN >=4 RESISTANT Resistant     TETRACYCLINE <=1 SENSITIVE Sensitive     VANCOMYCIN 1 SENSITIVE Sensitive     TRIMETH/SULFA <=10 SENSITIVE Sensitive  CLINDAMYCIN <=0.25 SENSITIVE Sensitive     RIFAMPIN <=0.5 SENSITIVE Sensitive     Inducible Clindamycin NEGATIVE Sensitive     * METHICILLIN RESISTANT STAPHYLOCOCCUS AUREUS  Culture, blood (routine x 2)     Status: Abnormal   Collection Time: 09/07/16  3:33 PM  Result Value Ref Range Status   Specimen Description BLOOD RIGHT HAND  Final   Special Requests IN PEDIATRIC BOTTLE 2CC  Final   Culture  Setup Time   Final    GRAM POSITIVE COCCI IN CLUSTERS IN PEDIATRIC BOTTLE CRITICAL RESULT CALLED TO, READ BACK BY AND VERIFIED WITH: S. JEFFERS, NURSE AT 0742 ON [phone] BY Lucienne CapersS. YARBROUGH    Culture (A)  Final    STAPHYLOCOCCUS AUREUS SUSCEPTIBILITIES PERFORMED ON PREVIOUS CULTURE WITHIN THE LAST 5 DAYS.    Report Status 09/10/2016 FINAL  Final  Culture, blood (routine x 2)     Status: None   Collection Time: 09/10/16  3:30 PM  Result Value Ref Range Status   Specimen Description BLOOD RIGHT ARM  Final   Special Requests IN PEDIATRIC BOTTLE 1CC  Final   Culture NO GROWTH 5 DAYS  Final   Report Status 09/15/2016 FINAL  Final  Culture, blood (routine x 2)     Status: Abnormal   Collection Time: 09/10/16  3:32 PM  Result Value Ref Range Status   Specimen Description BLOOD RIGHT ARM   Final   Special Requests IN PEDIATRIC BOTTLE .5CC  Final   Culture  Setup Time   Final    GRAM POSITIVE COCCI IN CLUSTERS IN PEDIATRIC BOTTLE CRITICAL RESULT CALLED TO, READ BACK BY AND VERIFIED WITH: M. PERRY, NURSE AT 1306 ON [phone] BY Lucienne CapersS. YARBROUGH    Culture (A)  Final    STAPHYLOCOCCUS AUREUS SUSCEPTIBILITIES PERFORMED ON PREVIOUS CULTURE WITHIN THE LAST 5 DAYS.    Report Status 09/12/2016 FINAL  Final  Culture, blood (routine x 2)     Status: None   Collection Time: 09/14/16 12:01 PM  Result Value Ref Range Status   Specimen Description BLOOD RIGHT HAND  Final   Special Requests IN PEDIATRIC BOTTLE 2CC  Final   Culture NO GROWTH 5 DAYS  Final   Report Status 09/19/2016 FINAL  Final  Culture, blood (routine x 2)     Status: None   Collection Time: 09/14/16 12:07 PM  Result Value Ref Range Status   Specimen Description BLOOD LEFT HAND  Final   Special Requests IN PEDIATRIC BOTTLE 2CC  Final   Culture NO GROWTH 5 DAYS  Final   Report Status 09/19/2016 FINAL  Final    Coagulation Studies: No results for input(s): LABPROT, INR in the last 72 hours.  Urinalysis: No results for input(s): COLORURINE, LABSPEC, PHURINE, GLUCOSEU, HGBUR, BILIRUBINUR, KETONESUR, PROTEINUR, UROBILINOGEN, NITRITE, LEUKOCYTESUR in the last 72 hours.  Invalid input(s): APPERANCEUR    Imaging: No results found.   Medications:    Aranesp 100 Midodrine 10 BID Phoslo 667 tid sensipar 30   Assessment/ Plan:  65 y.o. male  with a PMHx of ESRD on dialysis for 9 years, hypertension, coronary artery disease, previous CVA, severe peripheral vascular disease, bilateral above-the-knee amputations, who was admitted to [facility] on 08/26/2016 for ongoing management of a very severe large sacral decubitus ulcer.   1.  ESRD on HD:  dialysis catheter exchanged 09/08/16 for bacteremia and pt had subsequent removal catheter on 11/20 with replacement at a different site.   -  continue TTS Schedule  - vascular  surgery evaluation in progress for permanent access  2.  Anemia chronic kidney disease. Hemoglobin currently 9.9. Continue to monitor and continue Aranesp.  3.  Secondary hyperparathyroidism.  Most recent phosphorus was 3.9 and under good control. sensipar,  calcium acetate  4. Large sacral decubitus ulcer.  patient will need to be able to sit for approximately 5-6 hours to be able to be transitioned to outpatient dialysis.   5.  Hypotension:  -Continue midodrine.      LOS: 0 [place] Welcome 11/29/201710:00 AM

## 2016-09-22 NOTE — Progress Notes (Addendum)
---  Received order for vein mapping at 8:43 am 11/29. Previous order was entered incorrectly on 11/27 and vascular lab was not aware of this until today.   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  Segment Diameter Depth Comment  1. Prx upper 3.6977mm 16.265mm   2. Mid upper arm 2.6674mm 5.279mm   3. Above [facility] 2.6279mm 7.663mm   4. In AC 2.6519mm mm thrombus  5. Below AC 2.3892mm mm thrombus  6. Mid forearm 2.172mm mm branch  7. Wrist 2.386mm mm    mm mm    mm mm    mm mm    Right Basilic  Segment Diameter Depth Comment  1. Axilla mm mm   2. Mid upper arm 8.3772mm mm thrombus  3. Above AC 3.6201mm mm thrombus  4. In AC 4.9116mm mm thrombus  5. Below AC mm mm Not visualized  6. Mid forearm mm mm   7. Wrist mm mm    mm mm    mm mm    mm mm     Left Cephalic  Segment Diameter Depth Comment  1. Axilla 3.9701mm 9.3736mm   2. Mid upper arm 12.458mm mm thrombus  3. Above AC 13.903mm mm thrombus  4. In AC 6.6607mm mm thrombus  5. Below AC 6.5448mm mm thrombus  6. Mid forearm 2.3033mm mm   7. Wrist 1.3032mm mm    mm mm    mm mm    mm mm    Left Basilic  Segment Diameter Depth Comment  1. Axilla mm mm   2. Mid upper arm 6.6027mm 21.594mm   3. Above [facility] 4.7525mm 16.858mm   4. In [facility] 3.3995mm 11.602mm Multiple branches  5. Below AC 2.5069mm mm   6. Mid forearm 2.9243mm mm   7. Wrist mm mm    mm mm    mm mm    mm mm     Farrel DemarkJill Eunice, RDMS, RVT 09/22/2016

## 2016-09-22 NOTE — Progress Notes (Signed)
Patient ID: Christopher RomeKenneth Walls, male   DOB: 1950-12-13, 65 y.o.   MRN: [id number] Note written in paper [facility] chart as well. Vein map ordered at 11 AM on 1127 still not done. Discussed with charge nurse and ward secretary to reorder this. Cannot make a decision regarding permanent access until this is done. Would prefer placing basilic vein fistula if possible due to his extreme infection risk with prosthetic graft. Plan pending vein map

## 2016-09-22 NOTE — Procedures (Signed)
Interventional Radiology Procedure Note  Procedure: Placement of a  Right IJ non-tunneled Mahurker Trialysis catheter.  Catheter tip sin the cavoatrial jxn and ready for use.   Complications: None  Estimated Blood Loss: None  Recommendations:  - Routine line care  Signed,  Sterling BigHeath K. McCullough, MD

## 2016-09-22 NOTE — Progress Notes (Signed)
Patient ID: Christopher RomeKenneth Walls, male   DOB: Oct 02, 1951, 65 y.o.   MRN: [id number] Vein map reviewed. Not a candidate for fistula with the no basilic vein available. We'll plan tunneled catheter placement on Friday. Could possibly be a candidate for Gore-Tex graft in the future depending on disposition

## 2016-09-23 LAB — RENAL FUNCTION PANEL
ANION GAP: 14 (ref 5–15)
Albumin: 2.8 g/dL — ABNORMAL LOW (ref 3.5–5.0)
BUN: 31 mg/dL — ABNORMAL HIGH (ref 6–20)
CALCIUM: 9.3 mg/dL (ref 8.9–10.3)
CHLORIDE: 98 mmol/L — AB (ref 101–111)
CO2: 23 mmol/L (ref 22–32)
Creatinine, Ser: 10.46 mg/dL — ABNORMAL HIGH (ref 0.61–1.24)
GFR calc non Af Amer: 5 mL/min — ABNORMAL LOW (ref >60)
GFR, EST AFRICAN AMERICAN: 5 mL/min — AB (ref >60)
Glucose, Bld: 107 mg/dL — ABNORMAL HIGH (ref 65–99)
Phosphorus: 4.2 mg/dL (ref 2.5–4.6)
Potassium: 4.5 mmol/L (ref 3.5–5.1)
Sodium: 135 mmol/L (ref 135–145)

## 2016-09-23 LAB — CBC
HCT: 36.2 % — ABNORMAL LOW (ref 39.0–52.0)
HEMOGLOBIN: 10.9 g/dL — AB (ref 13.0–17.0)
MCH: 30.4 pg (ref 26.0–34.0)
MCHC: 30.1 g/dL (ref 30.0–36.0)
MCV: 101.1 fL — AB (ref 78.0–100.0)
Platelets: 252 10*3/uL (ref 150–400)
RBC: 3.58 MIL/uL — AB (ref 4.22–5.81)
RDW: 16.6 % — ABNORMAL HIGH (ref 11.5–15.5)
WBC: 11.9 10*3/uL — ABNORMAL HIGH (ref 4.0–10.5)

## 2016-09-23 LAB — VANCOMYCIN, TROUGH: Vancomycin Tr: 34 ug/mL (ref 15–20)

## 2016-09-23 NOTE — Progress Notes (Signed)
  [facility] Progress Note   Plan OR tomorrow for tunneled dialysis catheter placement with Dr. Randie Heinzain. Hold off on graft placement until infectious issues resolved. Discussed with patient at bedside. Pre-op orders written in chart.   Christopher BergerKimberly Darivs Lunden, PA-C [facility] Office: [phone] Pager: [phone] 09/23/2016 9:18 AM

## 2016-09-24 ENCOUNTER — Encounter
Admission: RE | Disposition: A | Discharge status: Skilled Nursing Facility | Payer: Self-pay | Source: Other Acute Inpatient Hospital | Attending: Internal Medicine

## 2016-09-24 ENCOUNTER — Other Ambulatory Visit (HOSPITAL_COMMUNITY): Payer: Medicare Other

## 2016-09-24 ENCOUNTER — Encounter (HOSPITAL_COMMUNITY): Payer: Self-pay | Admitting: Urology

## 2016-09-24 ENCOUNTER — Encounter (HOSPITAL_COMMUNITY): Payer: Medicare Other | Admitting: Certified Registered Nurse Anesthetist

## 2016-09-24 DIAGNOSIS — N185 Chronic kidney disease, stage 5: Secondary | ICD-10-CM

## 2016-09-24 HISTORY — PX: INSERTION OF DIALYSIS CATHETER: SHX1324

## 2016-09-24 LAB — BASIC METABOLIC PANEL
ANION GAP: 13 (ref 5–15)
BUN: 22 mg/dL — AB (ref 6–20)
CO2: 24 mmol/L (ref 22–32)
Calcium: 9.4 mg/dL (ref 8.9–10.3)
Chloride: 97 mmol/L — ABNORMAL LOW (ref 101–111)
Creatinine, Ser: 8.73 mg/dL — ABNORMAL HIGH (ref 0.61–1.24)
GFR, EST AFRICAN AMERICAN: 6 mL/min — AB (ref >60)
GFR, EST NON AFRICAN AMERICAN: 6 mL/min — AB (ref >60)
Glucose, Bld: 89 mg/dL (ref 65–99)
POTASSIUM: 4.6 mmol/L (ref 3.5–5.1)
SODIUM: 134 mmol/L — AB (ref 135–145)

## 2016-09-24 LAB — HEPATITIS B SURFACE ANTIGEN: HEP B S AG: NEGATIVE

## 2016-09-24 LAB — CBC
HCT: 36 % — ABNORMAL LOW (ref 39.0–52.0)
HEMOGLOBIN: 10.7 g/dL — AB (ref 13.0–17.0)
MCH: 30.2 pg (ref 26.0–34.0)
MCHC: 29.7 g/dL — ABNORMAL LOW (ref 30.0–36.0)
MCV: 101.7 fL — ABNORMAL HIGH (ref 78.0–100.0)
PLATELETS: 204 10*3/uL (ref 150–400)
RBC: 3.54 MIL/uL — AB (ref 4.22–5.81)
RDW: 16.4 % — ABNORMAL HIGH (ref 11.5–15.5)
WBC: 14.2 10*3/uL — AB (ref 4.0–10.5)

## 2016-09-24 LAB — GLUCOSE, CAPILLARY: GLUCOSE-CAPILLARY: 102 mg/dL — AB (ref 65–99)

## 2016-09-24 LAB — VANCOMYCIN, TROUGH: VANCOMYCIN TR: 26 ug/mL — AB (ref 15–20)

## 2016-09-24 SURGERY — INSERTION OF DIALYSIS CATHETER
Anesthesia: Monitor Anesthesia Care | Site: Neck | Laterality: Left

## 2016-09-24 MED ORDER — PHENYLEPHRINE HCL 10 MG/ML IJ SOLN
INTRAVENOUS | Status: DC | PRN
  Administered 2016-09-24: 50 ug/min via INTRAVENOUS

## 2016-09-24 MED ORDER — LIDOCAINE HCL 1 % IJ SOLN
INTRAMUSCULAR | Status: DC | PRN
  Administered 2016-09-24: 20 mL

## 2016-09-24 MED ORDER — HEPARIN SODIUM (PORCINE) 1000 UNIT/ML IJ SOLN
INTRAMUSCULAR | Status: AC
  Filled 2016-09-24: qty 1

## 2016-09-24 MED ORDER — CEFAZOLIN SODIUM-DEXTROSE 2-4 GM/100ML-% IV SOLN
INTRAVENOUS | Status: AC
Start: 2016-09-24 — End: 2016-09-25
  Filled 2016-09-24: qty 100

## 2016-09-24 MED ORDER — PROPOFOL 500 MG/50ML IV EMUL
INTRAVENOUS | Status: DC | PRN
  Administered 2016-09-24: 75 ug/kg/min via INTRAVENOUS

## 2016-09-24 MED ORDER — LIDOCAINE HCL (PF) 1 % IJ SOLN
INTRAMUSCULAR | Status: AC
  Filled 2016-09-24: qty 30

## 2016-09-24 MED ORDER — ONDANSETRON HCL 4 MG/2ML IJ SOLN
INTRAMUSCULAR | Status: DC | PRN
  Administered 2016-09-24: 4 mg via INTRAVENOUS

## 2016-09-24 MED ORDER — MIDAZOLAM HCL 5 MG/5ML IJ SOLN
INTRAMUSCULAR | Status: DC | PRN
  Administered 2016-09-24: 2 mg via INTRAVENOUS

## 2016-09-24 MED ORDER — CEFAZOLIN SODIUM-DEXTROSE 2-3 GM-% IV SOLR
INTRAVENOUS | Status: DC | PRN
  Administered 2016-09-24: 2 g via INTRAVENOUS

## 2016-09-24 MED ORDER — PHENYLEPHRINE HCL 10 MG/ML IJ SOLN
INTRAMUSCULAR | Status: DC | PRN
  Administered 2016-09-24 (×2): 80 ug via INTRAVENOUS
  Administered 2016-09-24 (×2): 120 ug via INTRAVENOUS

## 2016-09-24 MED ORDER — FENTANYL CITRATE (PF) 250 MCG/5ML IJ SOLN
INTRAMUSCULAR | Status: AC
  Filled 2016-09-24: qty 5

## 2016-09-24 MED ORDER — SODIUM CHLORIDE 0.9 % IV SOLN
INTRAVENOUS | Status: DC | PRN
  Administered 2016-09-24: 500 mL

## 2016-09-24 MED ORDER — SODIUM CHLORIDE 0.9 % IV SOLN: INTRAVENOUS | Status: DC

## 2016-09-24 MED ORDER — FENTANYL CITRATE (PF) 100 MCG/2ML IJ SOLN
INTRAMUSCULAR | Status: DC | PRN
  Administered 2016-09-24: 50 ug via INTRAVENOUS

## 2016-09-24 MED ORDER — SODIUM CHLORIDE 0.9 % IV SOLN
INTRAVENOUS | Status: DC
  Administered 2016-09-24: 12:00:00 via INTRAVENOUS

## 2016-09-24 MED ORDER — HEPARIN SODIUM (PORCINE) 1000 UNIT/ML IJ SOLN
INTRAMUSCULAR | Status: DC | PRN
  Administered 2016-09-24: 3.4 mL

## 2016-09-24 MED ORDER — FENTANYL CITRATE (PF) 100 MCG/2ML IJ SOLN: 25.0000 ug | INTRAMUSCULAR | Status: DC | PRN

## 2016-09-24 MED ORDER — MIDAZOLAM HCL 2 MG/2ML IJ SOLN
INTRAMUSCULAR | Status: AC
  Filled 2016-09-24: qty 2

## 2016-09-24 SURGICAL SUPPLY — 40 items
BAG DECANTER FOR FLEXI CONT (MISCELLANEOUS) IMPLANT
BIOPATCH RED 1 DISK 7.0 (GAUZE/BANDAGES/DRESSINGS) ×2 IMPLANT
CATH PALINDROME REV 23CM (CATHETERS) ×2 IMPLANT
CATH PALINDROME RT-P 15FX19CM (CATHETERS) IMPLANT
CATH PALINDROME RT-P 15FX23CM (CATHETERS) IMPLANT
CATH PALINDROME RT-P 15FX28CM (CATHETERS) IMPLANT
CATH PALINDROME RT-P 15FX55CM (CATHETERS) IMPLANT
COVER PROBE W GEL 5X96 (DRAPES) ×2 IMPLANT
COVER SURGICAL LIGHT HANDLE (MISCELLANEOUS) ×2 IMPLANT
DERMABOND ADVANCED (GAUZE/BANDAGES/DRESSINGS) ×1
DERMABOND ADVANCED .7 DNX12 (GAUZE/BANDAGES/DRESSINGS) ×1 IMPLANT
DRAPE C-ARM 42X72 X-RAY (DRAPES) IMPLANT
DRAPE CHEST BREAST 15X10 FENES (DRAPES) ×2 IMPLANT
GAUZE SPONGE 2X2 8PLY STRL LF (GAUZE/BANDAGES/DRESSINGS) IMPLANT
GAUZE SPONGE 4X4 16PLY XRAY LF (GAUZE/BANDAGES/DRESSINGS) ×2 IMPLANT
GLOVE BIO SURGEON STRL SZ7.5 (GLOVE) ×2 IMPLANT
GOWN STRL REUS W/ TWL LRG LVL3 (GOWN DISPOSABLE) ×1 IMPLANT
GOWN STRL REUS W/ TWL XL LVL3 (GOWN DISPOSABLE) IMPLANT
GOWN STRL REUS W/TWL LRG LVL3 (GOWN DISPOSABLE) ×1
GOWN STRL REUS W/TWL XL LVL3 (GOWN DISPOSABLE)
KIT BASIN OR (CUSTOM PROCEDURE TRAY) ×2 IMPLANT
KIT ROOM TURNOVER OR (KITS) ×2 IMPLANT
NEEDLE 18GX1X1/2 (RX/OR ONLY) (NEEDLE) ×2 IMPLANT
NEEDLE HYPO 25GX1X1/2 BEV (NEEDLE) ×2 IMPLANT
NS IRRIG 1000ML POUR BTL (IV SOLUTION) ×2 IMPLANT
PACK SURGICAL SETUP 50X90 (CUSTOM PROCEDURE TRAY) ×2 IMPLANT
PAD ARMBOARD 7.5X6 YLW CONV (MISCELLANEOUS) ×4 IMPLANT
SOAP 2 % CHG 4 OZ (WOUND CARE) ×2 IMPLANT
SPONGE GAUZE 2X2 STER 10/PKG (GAUZE/BANDAGES/DRESSINGS)
SPONGE GAUZE 4X4 12PLY STER LF (GAUZE/BANDAGES/DRESSINGS) ×2 IMPLANT
SUT ETHILON 3 0 PS 1 (SUTURE) ×2 IMPLANT
SUT MNCRL AB 4-0 PS2 18 (SUTURE) IMPLANT
SYR 20CC LL (SYRINGE) ×4 IMPLANT
SYR 5ML LL (SYRINGE) ×2 IMPLANT
SYR CONTROL 10ML LL (SYRINGE) ×2 IMPLANT
SYRINGE 10CC LL (SYRINGE) ×2 IMPLANT
TAPE CLOTH SURG 4X10 WHT LF (GAUZE/BANDAGES/DRESSINGS) ×2 IMPLANT
TOWEL OR 17X24 6PK STRL BLUE (TOWEL DISPOSABLE) ×2 IMPLANT
TOWEL OR 17X26 4PK STRL BLUE (TOWEL DISPOSABLE) ×2 IMPLANT
WATER STERILE IRR 1000ML POUR (IV SOLUTION) IMPLANT

## 2016-09-24 NOTE — Transfer of Care (Signed)
Immediate Anesthesia Transfer of Care Note  Patient: Christopher Walls  Procedure(s) Performed: Procedure(s): INSERTION OF DIALYSIS CATHETER (Left)  Patient Location: PACU  Anesthesia Type:MAC  Level of Consciousness: awake, alert  and oriented  Airway & Oxygen Therapy: Patient Spontanous Breathing  Post-op Assessment: Report given to RN and Post -op Vital signs reviewed and stable  Post vital signs: Reviewed and stable  Last Vitals:  Vitals:   09/24/16 1206  BP: 91/67  Pulse: 98  Resp: 18  Temp: 36.8 C    Last Pain:  Vitals:   09/24/16 1206  TempSrc: Oral         Complications: No apparent anesthesia complications

## 2016-09-24 NOTE — Anesthesia Preprocedure Evaluation (Addendum)
Anesthesia Evaluation  Patient identified by MRN, date of birth, ID band Patient awake    Reviewed: Allergy & Precautions, H&P , NPO status , Patient's Chart, lab work & pertinent test results  Airway Mallampati: II  TM Distance: >3 FB Neck ROM: Full    Dental no notable dental hx. (+) Teeth Intact, Dental Advisory Given   Pulmonary sleep apnea and Continuous Positive Airway Pressure Ventilation ,    Pulmonary exam normal breath sounds clear to auscultation       Cardiovascular hypertension, Pt. on medications + CAD, + Cardiac Stents and +CHF   Rhythm:Regular Rate:Normal     Neuro/Psych Anxiety Depression CVA negative neurological ROS     GI/Hepatic negative GI ROS, Neg liver ROS,   Endo/Other  Morbid obesity  Renal/GU ESRF and DialysisRenal disease  negative genitourinary   Musculoskeletal   Abdominal   Peds  Hematology negative hematology ROS (+)   Anesthesia Other Findings   Reproductive/Obstetrics negative OB ROS                           Anesthesia Physical Anesthesia Plan  ASA: IV  Anesthesia Plan: MAC   Post-op Pain Management:    Induction: Intravenous  Airway Management Planned: Simple Face Mask  Additional Equipment:   Intra-op Plan:   Post-operative Plan:   Informed Consent: I have reviewed the patients History and Physical, chart, labs and discussed the procedure including the risks, benefits and alternatives for the proposed anesthesia with the patient or authorized representative who has indicated his/her understanding and acceptance.   Dental advisory given  Plan Discussed with: Anesthesiologist and Surgeon  Anesthesia Plan Comments:       Anesthesia Quick Evaluation

## 2016-09-24 NOTE — Progress Notes (Signed)
Patient ID: Christopher Walls, male   DOB: 11/03/1950, 65 y.o.   MRN: [id number] Patient for catheter today. Extremely complex patient. Discussed vein mapping with patient which showed thromboses and inadequate cephalic and basilic veins bilaterally. He does have a patent basilic vein on the left but does not have a brachial pulse. Do not feel he is a candidate for upper extremity access.  Could possibly have femoral loop graft. Feel that he has prohibitive risk for infection due to a very large sacral decubitus and also a colostomy in his left lower quadrant. Recommend continued tunneled catheter for access.  For unknown reasons he was sent to interventional radiology yesterday for a non-tunneled catheter which was placed in his right internal jugular vein.  Physical exam he has had prior left neck access. Would prefer to place a new tunneled catheter on his left neck and chest since he has not had a catheter there recently. We'll proceed with this later today.  Discussed all of this at great length with the patient who understands

## 2016-09-24 NOTE — Anesthesia Postprocedure Evaluation (Signed)
Anesthesia Post Note  Patient: Christopher Walls  Procedure(s) Performed: Procedure(s) (LRB): INSERTION OF DIALYSIS CATHETER (Left)  Patient location during evaluation: PACU Anesthesia Type: MAC Pain management: pain level controlled Respiratory status: spontaneous breathing Cardiovascular status: stable Anesthetic complications: no    Last Vitals:  Vitals:   09/24/16 1330 09/24/16 1357  BP:  98/70  Pulse:  64  Resp: 18 20  Temp: 36.2 C 36.1 C    Last Pain:  Vitals:   09/24/16 1357  TempSrc:   PainSc: 0-No pain                 EDWARDS,Launa Goedken

## 2016-09-24 NOTE — H&P (View-Only) (Signed)
Patient ID: Christopher Walls, male   DOB: 04/15/1951, 65 y.o.   MRN: [id number] Patient for catheter today. Extremely complex patient. Discussed vein mapping with patient which showed thromboses and inadequate cephalic and basilic veins bilaterally. He does have a patent basilic vein on the left but does not have a brachial pulse. Do not feel he is a candidate for upper extremity access.  Could possibly have femoral loop graft. Feel that he has prohibitive risk for infection due to a very large sacral decubitus and also a colostomy in his left lower quadrant. Recommend continued tunneled catheter for access.  For unknown reasons he was sent to interventional radiology yesterday for a non-tunneled catheter which was placed in his right internal jugular vein.  Physical exam he has had prior left neck access. Would prefer to place a new tunneled catheter on his left neck and chest since he has not had a catheter there recently. We'll proceed with this later today.  Discussed all of this at great length with the patient who understands 

## 2016-09-24 NOTE — Interval H&P Note (Signed)
History and Physical Interval Note:  09/24/2016 12:07 PM  Christopher Walls  has presented today for surgery, with the diagnosis of End Stage Renal Disease N18.6  The various methods of treatment have been discussed with the patient and family. After consideration of risks, benefits and other options for treatment, the patient has consented to  Procedure(s): INSERTION OF DIALYSIS CATHETER (N/A) as a surgical intervention .  The patient's history has been reviewed, patient examined, no change in status, stable for surgery.  I have reviewed the patient's chart and labs.  Questions were answered to the patient's satisfaction.     Fabienne BrunsFields, Christopher Walls

## 2016-09-24 NOTE — Op Note (Signed)
Procedure: Ultrasound-guided insertion of Palindrome catheter left internal jugular vein.  Removal right side dialysis catheter  Preoperative diagnosis: End-stage renal disease  Postoperative diagnosis: Same  Anesthesia: Local with IV sedation  Operative findings: 23 cm Diatek catheter left internal jugular vein  Operative details: After obtaining informed consent, the patient was taken to the operating room. The patient was placed in supine position on the operating room table. After adequate sedation the patient's entire neck and chest were prepped and draped in usual sterile fashion. The patient was placed in Trendelenburg position. Ultrasound was used to identify the patient's left internal jugular vein. This had normal compressibility and respiratory variation. Local anesthesia was infiltrated over the left jugular vein.  Using ultrasound guidance, the left internal jugular vein was successfully cannulated.  A 0.035 J-tipped guidewire was threaded into the left internal jugular vein and into the superior vena cava followed by the inferior vena cava under fluoroscopic guidance.   Next sequential 12 and 14 dilators were placed over the guidewire into the right atrium.  A 16 French dilator with a peel-away sheath was then placed over the guidewire into the right atrium.   The guidewire and dilator were removed. A 23 cm Palindrome catheter was then placed through the peel away sheath into the right atrium.  The catheter was then tunneled subcutaneously, cut to length, and the hub attached. The catheter was noted to flush and draw easily. The catheter was inspected under fluoroscopy and found with its tip to be in the right atrium without any kinks throughout its course. The catheter was sutured to the skin with nylon sutures. The neck insertion site was closed with Vicryl stitch. The catheter was then loaded with concentrated Heparin solution. A dry sterile dressing was applied.  Next the sutures were  removed from the right side catheter and it was removed and hemostasis obtained with direct pressure.  The patient tolerated procedure well and there were no complications. Instrument sponge and needle counts correct in the case. The patient was taken to the recovery room in stable condition. Chest x-ray will be obtained in the recovery room.  Fabienne Brunsharles Yazhini Mcaulay, MD [facility] [place] Office: [phone] Pager: [phone]

## 2016-09-25 ENCOUNTER — Encounter (HOSPITAL_COMMUNITY): Payer: Self-pay | Admitting: Vascular Surgery

## 2016-09-25 LAB — RENAL FUNCTION PANEL
Albumin: 2.7 g/dL — ABNORMAL LOW (ref 3.5–5.0)
Anion gap: 14 (ref 5–15)
BUN: 27 mg/dL — ABNORMAL HIGH (ref 6–20)
CALCIUM: 9 mg/dL (ref 8.9–10.3)
CO2: 23 mmol/L (ref 22–32)
CREATININE: 10.37 mg/dL — AB (ref 0.61–1.24)
Chloride: 97 mmol/L — ABNORMAL LOW (ref 101–111)
GFR calc Af Amer: 5 mL/min — ABNORMAL LOW (ref >60)
GFR calc non Af Amer: 5 mL/min — ABNORMAL LOW (ref >60)
GLUCOSE: 89 mg/dL (ref 65–99)
Phosphorus: 4.2 mg/dL (ref 2.5–4.6)
Potassium: 4.7 mmol/L (ref 3.5–5.1)
SODIUM: 134 mmol/L — AB (ref 135–145)

## 2016-09-25 LAB — CBC
HCT: 34.2 % — ABNORMAL LOW (ref 39.0–52.0)
Hemoglobin: 10.1 g/dL — ABNORMAL LOW (ref 13.0–17.0)
MCH: 30 pg (ref 26.0–34.0)
MCHC: 29.5 g/dL — AB (ref 30.0–36.0)
MCV: 101.5 fL — ABNORMAL HIGH (ref 78.0–100.0)
PLATELETS: 214 10*3/uL (ref 150–400)
RBC: 3.37 MIL/uL — AB (ref 4.22–5.81)
RDW: 16.5 % — ABNORMAL HIGH (ref 11.5–15.5)
WBC: 10.6 10*3/uL — ABNORMAL HIGH (ref 4.0–10.5)

## 2016-09-25 LAB — VANCOMYCIN, TROUGH: Vancomycin Tr: 22 ug/mL (ref 15–20)

## 2016-09-27 NOTE — Progress Notes (Signed)
Subjective:  Patient due for hemodialysis again tomorrow. He is resting comfortably in bed at the moment. No acute complaints at the moment. New tunnelled dialysis catheter placed on 12/1 Reports pain in the decubitus off and on Able to eat without nausea or vomiting  Objective:  Vital signs in last 24 hours:  Temperature  97, pulse 64, respirations 20, blood pressure 91/67  Physical Exam: General: No acute distress, laying in the bed, obese  HEENT Anicteric, moist mucous membranes  Neck supple  Pulm/lungs normal breathing effort, coarse  CVS/Heart No rub or gallop, S1S2  Abdomen:  Soft, nontender, nondistended, diverting colostomy  Extremities: Bilateral above-the-knee amputation  Neurologic: Alert, able to follow simple commands  Skin: Wound VAC to the sacral decubitus ulcer  Access:  Left IJ tunneled dialysis catheter       Basic Metabolic Panel:   Recent Labs Lab 09/21/16 0536 09/23/16 0639 09/24/16 0500 09/25/16 0531  NA 136 135 134* 134*  K 4.7 4.5 4.6 4.7  CL 98* 98* 97* 97*  CO2 23 23 24 23   GLUCOSE 93 107* 89 89  BUN 30* 31* 22* 27*  CREATININE 10.07* 10.46* 8.73* 10.37*  CALCIUM 9.2 9.3 9.4 9.0  PHOS 3.8 4.2  --  4.2     CBC:  Recent Labs Lab 09/21/16 0536 09/23/16 0639 09/24/16 0500 09/25/16 0531  WBC 13.6* 11.9* 14.2* 10.6*  HGB 9.9* 10.9* 10.7* 10.1*  HCT 32.7* 36.2* 36.0* 34.2*  MCV 100.9* 101.1* 101.7* 101.5*  PLT 299 252 204 214      Microbiology:  Recent Results (from the past 720 hour(s))  Culture, blood (routine x 2)     Status: Abnormal   Collection Time: 09/04/16  2:15 PM  Result Value Ref Range Status   Specimen Description BLOOD RIGHT HAND  Final   Special Requests IN PEDIATRIC BOTTLE 5CC  Final   Culture  Setup Time   Final    IN PEDIATRIC BOTTLE GRAM POSITIVE COCCI IN CLUSTERS CRITICAL RESULT CALLED TO, READ BACK BY AND VERIFIED WITH: TO CMASEKO(RN) BY TCLEVELAND 09/05/2016 AT 6:28AM    Culture (A)  Final     STAPHYLOCOCCUS AUREUS SUSCEPTIBILITIES PERFORMED ON PREVIOUS CULTURE WITHIN THE LAST 5 DAYS.    Report Status 09/07/2016 FINAL  Final  Culture, blood (routine x 2)     Status: Abnormal   Collection Time: 09/04/16  2:25 PM  Result Value Ref Range Status   Specimen Description BLOOD RIGHT HAND  Final   Special Requests IN PEDIATRIC BOTTLE 2CC  Final   Culture  Setup Time   Final    GRAM POSITIVE COCCI IN CLUSTERS IN PEDIATRIC BOTTLE CRITICAL RESULT CALLED TO, READ BACK BY AND VERIFIED WITH: TO CMASEKO(RN) BY TCLEVELAND 09/05/2016 AT 6:28AM    Culture METHICILLIN RESISTANT STAPHYLOCOCCUS AUREUS (A)  Final   Report Status 09/07/2016 FINAL  Final   Organism ID, Bacteria METHICILLIN RESISTANT STAPHYLOCOCCUS AUREUS  Final      Susceptibility   Methicillin resistant staphylococcus aureus - MIC*    CIPROFLOXACIN >=8 RESISTANT Resistant     ERYTHROMYCIN <=0.25 SENSITIVE Sensitive     GENTAMICIN <=0.5 SENSITIVE Sensitive     OXACILLIN >=4 RESISTANT Resistant     TETRACYCLINE <=1 SENSITIVE Sensitive     VANCOMYCIN 1 SENSITIVE Sensitive     TRIMETH/SULFA <=10 SENSITIVE Sensitive     CLINDAMYCIN <=0.25 SENSITIVE Sensitive     RIFAMPIN <=0.5 SENSITIVE Sensitive     Inducible Clindamycin NEGATIVE Sensitive     *  METHICILLIN RESISTANT STAPHYLOCOCCUS AUREUS  Blood Culture ID Panel (Reflexed)     Status: Abnormal   Collection Time: 09/04/16  2:25 PM  Result Value Ref Range Status   Enterococcus species NOT DETECTED NOT DETECTED Final   Vancomycin resistance NOT DETECTED NOT DETECTED Final   Listeria monocytogenes NOT DETECTED NOT DETECTED Final   Staphylococcus species DETECTED (A) NOT DETECTED Final    Comment: CRITICAL RESULT CALLED TO, READ BACK BY AND VERIFIED WITH: TO CMASEKO(RN) BY TCLEVELAND 09/05/2016 AT 6:28AM    Staphylococcus aureus DETECTED (A) NOT DETECTED Final    Comment: CRITICAL RESULT CALLED TO, READ BACK BY AND VERIFIED WITH: TO CMASEKO(RN) BY TCLEVELAND 09/05/2016 AT  6:28AM    Methicillin resistance DETECTED (A) NOT DETECTED Final    Comment: CRITICAL RESULT CALLED TO, READ BACK BY AND VERIFIED WITH: TO CMASEKO(RN) BY TCLEVELAND 09/05/2016 AT 6:28AM    Streptococcus species NOT DETECTED NOT DETECTED Final   Streptococcus agalactiae NOT DETECTED NOT DETECTED Final   Streptococcus pneumoniae NOT DETECTED NOT DETECTED Final   Streptococcus pyogenes NOT DETECTED NOT DETECTED Final   Acinetobacter baumannii NOT DETECTED NOT DETECTED Final   Enterobacteriaceae species NOT DETECTED NOT DETECTED Final   Enterobacter cloacae complex NOT DETECTED NOT DETECTED Final   Escherichia coli NOT DETECTED NOT DETECTED Final   Klebsiella oxytoca NOT DETECTED NOT DETECTED Final   Klebsiella pneumoniae NOT DETECTED NOT DETECTED Final   Proteus species NOT DETECTED NOT DETECTED Final   Serratia marcescens NOT DETECTED NOT DETECTED Final   Carbapenem resistance NOT DETECTED NOT DETECTED Final   Haemophilus influenzae NOT DETECTED NOT DETECTED Final   Neisseria meningitidis NOT DETECTED NOT DETECTED Final   Pseudomonas aeruginosa NOT DETECTED NOT DETECTED Final   Candida albicans NOT DETECTED NOT DETECTED Final   Candida glabrata NOT DETECTED NOT DETECTED Final   Candida krusei NOT DETECTED NOT DETECTED Final   Candida parapsilosis NOT DETECTED NOT DETECTED Final   Candida tropicalis NOT DETECTED NOT DETECTED Final  Culture, blood (routine x 2)     Status: Abnormal   Collection Time: 09/07/16  3:26 PM  Result Value Ref Range Status   Specimen Description BLOOD RIGHT ARM  Final   Special Requests IN PEDIATRIC BOTTLE 2CC  Final   Culture  Setup Time   Final    GRAM POSITIVE COCCI IN CLUSTERS IN PEDIATRIC BOTTLE CRITICAL RESULT CALLED TO, READ BACK BY AND VERIFIED WITH: S. JEFFERSD, NURSE AT 0742 ON [id number] BY Lucienne CapersS. YARBROUGH    Culture METHICILLIN RESISTANT STAPHYLOCOCCUS AUREUS (A)  Final   Report Status 09/10/2016 FINAL  Final   Organism ID, Bacteria METHICILLIN  RESISTANT STAPHYLOCOCCUS AUREUS  Final      Susceptibility   Methicillin resistant staphylococcus aureus - MIC*    CIPROFLOXACIN >=8 RESISTANT Resistant     ERYTHROMYCIN <=0.25 SENSITIVE Sensitive     GENTAMICIN <=0.5 SENSITIVE Sensitive     OXACILLIN >=4 RESISTANT Resistant     TETRACYCLINE <=1 SENSITIVE Sensitive     VANCOMYCIN 1 SENSITIVE Sensitive     TRIMETH/SULFA <=10 SENSITIVE Sensitive     CLINDAMYCIN <=0.25 SENSITIVE Sensitive     RIFAMPIN <=0.5 SENSITIVE Sensitive     Inducible Clindamycin NEGATIVE Sensitive     * METHICILLIN RESISTANT STAPHYLOCOCCUS AUREUS  Culture, blood (routine x 2)     Status: Abnormal   Collection Time: 09/07/16  3:33 PM  Result Value Ref Range Status   Specimen Description BLOOD RIGHT HAND  Final   Special Requests IN PEDIATRIC  BOTTLE 2CC  Final   Culture  Setup Time   Final    GRAM POSITIVE COCCI IN CLUSTERS IN PEDIATRIC BOTTLE CRITICAL RESULT CALLED TO, READ BACK BY AND VERIFIED WITH: S. JEFFERS, NURSE AT 0742 ON [id number] BY Lucienne CapersS. YARBROUGH    Culture (A)  Final    STAPHYLOCOCCUS AUREUS SUSCEPTIBILITIES PERFORMED ON PREVIOUS CULTURE WITHIN THE LAST 5 DAYS.    Report Status 09/10/2016 FINAL  Final  Culture, blood (routine x 2)     Status: None   Collection Time: 09/10/16  3:30 PM  Result Value Ref Range Status   Specimen Description BLOOD RIGHT ARM  Final   Special Requests IN PEDIATRIC BOTTLE 1CC  Final   Culture NO GROWTH 5 DAYS  Final   Report Status 09/15/2016 FINAL  Final  Culture, blood (routine x 2)     Status: Abnormal   Collection Time: 09/10/16  3:32 PM  Result Value Ref Range Status   Specimen Description BLOOD RIGHT ARM  Final   Special Requests IN PEDIATRIC BOTTLE .5CC  Final   Culture  Setup Time   Final    GRAM POSITIVE COCCI IN CLUSTERS IN PEDIATRIC BOTTLE CRITICAL RESULT CALLED TO, READ BACK BY AND VERIFIED WITH: M. PERRY, NURSE AT 1306 ON [id number] BY Lucienne CapersS. YARBROUGH    Culture (A)  Final    STAPHYLOCOCCUS  AUREUS SUSCEPTIBILITIES PERFORMED ON PREVIOUS CULTURE WITHIN THE LAST 5 DAYS.    Report Status 09/12/2016 FINAL  Final  Culture, blood (routine x 2)     Status: None   Collection Time: 09/14/16 12:01 PM  Result Value Ref Range Status   Specimen Description BLOOD RIGHT HAND  Final   Special Requests IN PEDIATRIC BOTTLE 2CC  Final   Culture NO GROWTH 5 DAYS  Final   Report Status 09/19/2016 FINAL  Final  Culture, blood (routine x 2)     Status: None   Collection Time: 09/14/16 12:07 PM  Result Value Ref Range Status   Specimen Description BLOOD LEFT HAND  Final   Special Requests IN PEDIATRIC BOTTLE 2CC  Final   Culture NO GROWTH 5 DAYS  Final   Report Status 09/19/2016 FINAL  Final    Coagulation Studies: No results for input(s): LABPROT, INR in the last 72 hours.  Urinalysis: No results for input(s): COLORURINE, LABSPEC, PHURINE, GLUCOSEU, HGBUR, BILIRUBINUR, KETONESUR, PROTEINUR, UROBILINOGEN, NITRITE, LEUKOCYTESUR in the last 72 hours.  Invalid input(s): APPERANCEUR    Imaging: No results found.   Medications:    Aranesp 100 Midodrine 10 BID Phoslo 667 tid sensipar 30   Assessment/ Plan:  65 y.o. male  with a PMHx of ESRD on dialysis for 9 years, hypertension, coronary artery disease, previous CVA, severe peripheral vascular disease, bilateral above-the-knee amputations, who was admitted to [facility] on 08/26/2016 for ongoing management of a very severe large sacral decubitus ulcer.   1.  ESRD on HD:    -  continue TTS Schedule  - vascular surgery evaluation in progress for permanent access - Limited options due to high risk of infection from decubitus  2.  Anemia chronic kidney disease. Hemoglobin currently 10.1. Continue to monitor and continue Aranesp.  3.  Secondary hyperparathyroidism.  Most recent phosphorus was 4.2. sensipar,  calcium acetate  4. Large sacral decubitus ulcer.  patient will need to be able to sit for approximately 5-6 hours to be able to  be transitioned to outpatient dialysis.   5.  Hypotension:  -Continue midodrine.  LOS: 0 Halia Franey 12/4/20179:40 AM

## 2016-09-28 LAB — CBC
HEMATOCRIT: 30.5 % — AB (ref 39.0–52.0)
Hemoglobin: 9.3 g/dL — ABNORMAL LOW (ref 13.0–17.0)
MCH: 30.6 pg (ref 26.0–34.0)
MCHC: 30.5 g/dL (ref 30.0–36.0)
MCV: 100.3 fL — AB (ref 78.0–100.0)
Platelets: 203 10*3/uL (ref 150–400)
RBC: 3.04 MIL/uL — AB (ref 4.22–5.81)
RDW: 16.6 % — ABNORMAL HIGH (ref 11.5–15.5)
WBC: 10.9 10*3/uL — AB (ref 4.0–10.5)

## 2016-09-28 LAB — RENAL FUNCTION PANEL
ANION GAP: 8 (ref 5–15)
Albumin: 2.5 g/dL — ABNORMAL LOW (ref 3.5–5.0)
BUN: 19 mg/dL (ref 6–20)
CHLORIDE: 102 mmol/L (ref 101–111)
CO2: 28 mmol/L (ref 22–32)
Calcium: 8.1 mg/dL — ABNORMAL LOW (ref 8.9–10.3)
Creatinine, Ser: 5.71 mg/dL — ABNORMAL HIGH (ref 0.61–1.24)
GFR, EST AFRICAN AMERICAN: 11 mL/min — AB (ref >60)
GFR, EST NON AFRICAN AMERICAN: 9 mL/min — AB (ref >60)
Glucose, Bld: 93 mg/dL (ref 65–99)
POTASSIUM: 2.8 mmol/L — AB (ref 3.5–5.1)
Phosphorus: 1.8 mg/dL — ABNORMAL LOW (ref 2.5–4.6)
Sodium: 138 mmol/L (ref 135–145)

## 2016-09-28 NOTE — Progress Notes (Signed)
   VASCULAR SURGERY ASSESSMENT & PLAN:  This patient had a tunneled dialysis catheter placed by Dr. Fabienne Brunsharles Fields on 09/24/2016.  As per Dr. Bosie HelperEarly's note on 09/24/2016, his vein mapping shows inadequate cephalic and basilic veins bilaterally. He did not feel that he was a candidate for upper extremity access. He could potentially have a femoral loop graft, but he was felt to be a prohibitive risk because of infection given that he has a colostomy in his left lower quadrant which currently has some drainage around the colostomy site and also an extensive sacral decubitus. This was all discussed with the patient 4 days ago.  SUBJECTIVE: No specific complaints.  PHYSICAL EXAM: Vitals:   09/24/16 1144 09/24/16 1206 09/24/16 1330 09/24/16 1357  BP:  91/67  98/70  Pulse:  98  64  Resp:  18 18 20   Temp:  98.3 F (36.8 C) 97.2 F (36.2 C) 97 F (36.1 C)  TempSrc:  Oral    SpO2:  96%  92%  Weight: 220 lb 6.4 oz (100 kg)     Height: 3\' 9"  (1.143 m)      He has a sacral decubitus with a dressing on it. He has some drainage around his colostomy bag. He has bilateral AKA's.  LABS: Lab Results  Component Value Date   WBC 10.9 (H) 09/28/2016   HGB 9.3 (L) 09/28/2016   HCT 30.5 (L) 09/28/2016   MCV 100.3 (H) 09/28/2016   PLT 203 09/28/2016   Lab Results  Component Value Date   CREATININE 5.71 (H) 09/28/2016   Lab Results  Component Value Date   INR 1.16 08/27/2016   Cari Carawayhris Dickson Beeper: [phone] 09/28/2016

## 2016-09-29 NOTE — Progress Notes (Signed)
Subjective:  Patient due for hemodialysis again tomorrow. TTS Schedule He is resting comfortably in bed at the moment. No acute complaints at the moment. New tunnelled dialysis catheter placed on 12/1 Reports pain in the decubitus off and on Able to eat without nausea or vomiting Wound vac dressing changed today  Objective:  Vital signs in last 24 hours:  Temperature  97.8, pulse 99, respirations 15, blood pressure 100/48  Physical Exam: General: No acute distress, laying in the bed, obese  HEENT Anicteric, moist mucous membranes  Neck supple  Pulm/lungs normal breathing effort, coarse  CVS/Heart No rub or gallop, S1S2  Abdomen:  Soft, nontender, nondistended, diverting colostomy  Extremities: Bilateral above-the-knee amputation  Neurologic: Alert, able to follow simple commands  Skin: Wound VAC to the sacral decubitus ulcer  Access:  Left IJ tunneled dialysis catheter       Basic Metabolic Panel:   Recent Labs Lab 09/23/16 0639 09/24/16 0500 09/25/16 0531 09/28/16 1001  NA 135 134* 134* 138  K 4.5 4.6 4.7 2.8*  CL 98* 97* 97* 102  CO2 23 24 23 28   GLUCOSE 107* 89 89 93  BUN 31* 22* 27* 19  CREATININE 10.46* 8.73* 10.37* 5.71*  CALCIUM 9.3 9.4 9.0 8.1*  PHOS 4.2  --  4.2 1.8*     CBC:  Recent Labs Lab 09/23/16 0639 09/24/16 0500 09/25/16 0531 09/28/16 0623  WBC 11.9* 14.2* 10.6* 10.9*  HGB 10.9* 10.7* 10.1* 9.3*  HCT 36.2* 36.0* 34.2* 30.5*  MCV 101.1* 101.7* 101.5* 100.3*  PLT 252 204 214 203      Microbiology:  Recent Results (from the past 720 hour(s))  Culture, blood (routine x 2)     Status: Abnormal   Collection Time: 09/04/16  2:15 PM  Result Value Ref Range Status   Specimen Description BLOOD RIGHT HAND  Final   Special Requests IN PEDIATRIC BOTTLE 5CC  Final   Culture  Setup Time   Final    IN PEDIATRIC BOTTLE GRAM POSITIVE COCCI IN CLUSTERS CRITICAL RESULT CALLED TO, READ BACK BY AND VERIFIED WITH: TO CMASEKO(RN) BY TCLEVELAND  09/05/2016 AT 6:28AM    Culture (A)  Final    STAPHYLOCOCCUS AUREUS SUSCEPTIBILITIES PERFORMED ON PREVIOUS CULTURE WITHIN THE LAST 5 DAYS.    Report Status 09/07/2016 FINAL  Final  Culture, blood (routine x 2)     Status: Abnormal   Collection Time: 09/04/16  2:25 PM  Result Value Ref Range Status   Specimen Description BLOOD RIGHT HAND  Final   Special Requests IN PEDIATRIC BOTTLE 2CC  Final   Culture  Setup Time   Final    GRAM POSITIVE COCCI IN CLUSTERS IN PEDIATRIC BOTTLE CRITICAL RESULT CALLED TO, READ BACK BY AND VERIFIED WITH: TO CMASEKO(RN) BY TCLEVELAND 09/05/2016 AT 6:28AM    Culture METHICILLIN RESISTANT STAPHYLOCOCCUS AUREUS (A)  Final   Report Status 09/07/2016 FINAL  Final   Organism ID, Bacteria METHICILLIN RESISTANT STAPHYLOCOCCUS AUREUS  Final      Susceptibility   Methicillin resistant staphylococcus aureus - MIC*    CIPROFLOXACIN >=8 RESISTANT Resistant     ERYTHROMYCIN <=0.25 SENSITIVE Sensitive     GENTAMICIN <=0.5 SENSITIVE Sensitive     OXACILLIN >=4 RESISTANT Resistant     TETRACYCLINE <=1 SENSITIVE Sensitive     VANCOMYCIN 1 SENSITIVE Sensitive     TRIMETH/SULFA <=10 SENSITIVE Sensitive     CLINDAMYCIN <=0.25 SENSITIVE Sensitive     RIFAMPIN <=0.5 SENSITIVE Sensitive     Inducible Clindamycin  NEGATIVE Sensitive     * METHICILLIN RESISTANT STAPHYLOCOCCUS AUREUS  Blood Culture ID Panel (Reflexed)     Status: Abnormal   Collection Time: 09/04/16  2:25 PM  Result Value Ref Range Status   Enterococcus species NOT DETECTED NOT DETECTED Final   Vancomycin resistance NOT DETECTED NOT DETECTED Final   Listeria monocytogenes NOT DETECTED NOT DETECTED Final   Staphylococcus species DETECTED (A) NOT DETECTED Final    Comment: CRITICAL RESULT CALLED TO, READ BACK BY AND VERIFIED WITH: TO CMASEKO(RN) BY TCLEVELAND 09/05/2016 AT 6:28AM    Staphylococcus aureus DETECTED (A) NOT DETECTED Final    Comment: CRITICAL RESULT CALLED TO, READ BACK BY AND VERIFIED  WITH: TO CMASEKO(RN) BY TCLEVELAND 09/05/2016 AT 6:28AM    Methicillin resistance DETECTED (A) NOT DETECTED Final    Comment: CRITICAL RESULT CALLED TO, READ BACK BY AND VERIFIED WITH: TO CMASEKO(RN) BY TCLEVELAND 09/05/2016 AT 6:28AM    Streptococcus species NOT DETECTED NOT DETECTED Final   Streptococcus agalactiae NOT DETECTED NOT DETECTED Final   Streptococcus pneumoniae NOT DETECTED NOT DETECTED Final   Streptococcus pyogenes NOT DETECTED NOT DETECTED Final   Acinetobacter baumannii NOT DETECTED NOT DETECTED Final   Enterobacteriaceae species NOT DETECTED NOT DETECTED Final   Enterobacter cloacae complex NOT DETECTED NOT DETECTED Final   Escherichia coli NOT DETECTED NOT DETECTED Final   Klebsiella oxytoca NOT DETECTED NOT DETECTED Final   Klebsiella pneumoniae NOT DETECTED NOT DETECTED Final   Proteus species NOT DETECTED NOT DETECTED Final   Serratia marcescens NOT DETECTED NOT DETECTED Final   Carbapenem resistance NOT DETECTED NOT DETECTED Final   Haemophilus influenzae NOT DETECTED NOT DETECTED Final   Neisseria meningitidis NOT DETECTED NOT DETECTED Final   Pseudomonas aeruginosa NOT DETECTED NOT DETECTED Final   Candida albicans NOT DETECTED NOT DETECTED Final   Candida glabrata NOT DETECTED NOT DETECTED Final   Candida krusei NOT DETECTED NOT DETECTED Final   Candida parapsilosis NOT DETECTED NOT DETECTED Final   Candida tropicalis NOT DETECTED NOT DETECTED Final  Culture, blood (routine x 2)     Status: Abnormal   Collection Time: 09/07/16  3:26 PM  Result Value Ref Range Status   Specimen Description BLOOD RIGHT ARM  Final   Special Requests IN PEDIATRIC BOTTLE 2CC  Final   Culture  Setup Time   Final    GRAM POSITIVE COCCI IN CLUSTERS IN PEDIATRIC BOTTLE CRITICAL RESULT CALLED TO, READ BACK BY AND VERIFIED WITH: S. JEFFERSD, NURSE AT 0742 ON [id number] BY Lucienne Capers    Culture METHICILLIN RESISTANT STAPHYLOCOCCUS AUREUS (A)  Final   Report Status 09/10/2016 FINAL   Final   Organism ID, Bacteria METHICILLIN RESISTANT STAPHYLOCOCCUS AUREUS  Final      Susceptibility   Methicillin resistant staphylococcus aureus - MIC*    CIPROFLOXACIN >=8 RESISTANT Resistant     ERYTHROMYCIN <=0.25 SENSITIVE Sensitive     GENTAMICIN <=0.5 SENSITIVE Sensitive     OXACILLIN >=4 RESISTANT Resistant     TETRACYCLINE <=1 SENSITIVE Sensitive     VANCOMYCIN 1 SENSITIVE Sensitive     TRIMETH/SULFA <=10 SENSITIVE Sensitive     CLINDAMYCIN <=0.25 SENSITIVE Sensitive     RIFAMPIN <=0.5 SENSITIVE Sensitive     Inducible Clindamycin NEGATIVE Sensitive     * METHICILLIN RESISTANT STAPHYLOCOCCUS AUREUS  Culture, blood (routine x 2)     Status: Abnormal   Collection Time: 09/07/16  3:33 PM  Result Value Ref Range Status   Specimen Description BLOOD RIGHT HAND  Final   Special Requests IN PEDIATRIC BOTTLE 2CC  Final   Culture  Setup Time   Final    GRAM POSITIVE COCCI IN CLUSTERS IN PEDIATRIC BOTTLE CRITICAL RESULT CALLED TO, READ BACK BY AND VERIFIED WITH: S. JEFFERS, NURSE AT 0742 ON [id number] BY Lucienne Capers    Culture (A)  Final    STAPHYLOCOCCUS AUREUS SUSCEPTIBILITIES PERFORMED ON PREVIOUS CULTURE WITHIN THE LAST 5 DAYS.    Report Status 09/10/2016 FINAL  Final  Culture, blood (routine x 2)     Status: None   Collection Time: 09/10/16  3:30 PM  Result Value Ref Range Status   Specimen Description BLOOD RIGHT ARM  Final   Special Requests IN PEDIATRIC BOTTLE 1CC  Final   Culture NO GROWTH 5 DAYS  Final   Report Status 09/15/2016 FINAL  Final  Culture, blood (routine x 2)     Status: Abnormal   Collection Time: 09/10/16  3:32 PM  Result Value Ref Range Status   Specimen Description BLOOD RIGHT ARM  Final   Special Requests IN PEDIATRIC BOTTLE .5CC  Final   Culture  Setup Time   Final    GRAM POSITIVE COCCI IN CLUSTERS IN PEDIATRIC BOTTLE CRITICAL RESULT CALLED TO, READ BACK BY AND VERIFIED WITH: M. PERRY, NURSE AT 1306 ON [id number] BY Lucienne Capers    Culture (A)   Final    STAPHYLOCOCCUS AUREUS SUSCEPTIBILITIES PERFORMED ON PREVIOUS CULTURE WITHIN THE LAST 5 DAYS.    Report Status 09/12/2016 FINAL  Final  Culture, blood (routine x 2)     Status: None   Collection Time: 09/14/16 12:01 PM  Result Value Ref Range Status   Specimen Description BLOOD RIGHT HAND  Final   Special Requests IN PEDIATRIC BOTTLE 2CC  Final   Culture NO GROWTH 5 DAYS  Final   Report Status 09/19/2016 FINAL  Final  Culture, blood (routine x 2)     Status: None   Collection Time: 09/14/16 12:07 PM  Result Value Ref Range Status   Specimen Description BLOOD LEFT HAND  Final   Special Requests IN PEDIATRIC BOTTLE 2CC  Final   Culture NO GROWTH 5 DAYS  Final   Report Status 09/19/2016 FINAL  Final    Coagulation Studies: No results for input(s): LABPROT, INR in the last 72 hours.  Urinalysis: No results for input(s): COLORURINE, LABSPEC, PHURINE, GLUCOSEU, HGBUR, BILIRUBINUR, KETONESUR, PROTEINUR, UROBILINOGEN, NITRITE, LEUKOCYTESUR in the last 72 hours.  Invalid input(s): APPERANCEUR    Imaging: No results found.   Medications:    Aranesp 100 Midodrine 10 BID Phoslo 667 tid sensipar 30   Assessment/ Plan:  65 y.o. male  with a PMHx of ESRD on dialysis for 9 years, hypertension, coronary artery disease, previous CVA, severe peripheral vascular disease, bilateral above-the-knee amputations, who was admitted to [facility] on 08/26/2016 for ongoing management of a very severe large sacral decubitus ulcer.   1.  ESRD on HD:    -  continue TTS Schedule  - vascular surgery evaluation in progress for permanent access - Limited options due to high risk of infection from decubitus  2.  Anemia chronic kidney disease. Hemoglobin currently 9.3. Continue to monitor and continue Aranesp.  3.  Secondary hyperparathyroidism.  Most recent phosphorus was 1.8. sensipar,  calcium acetate  4. Large sacral decubitus ulcer.  patient will need to be able to sit for approximately  5-6 hours to be able to be transitioned to outpatient dialysis. Accepted into his regular dialysis unit  as stretcher patient  5.  Hypotension:  -Continue midodrine.  6. Hypokalemia - drawn at the end of dialysis- results not valid      LOS: 0 Lillyn Wieczorek 12/6/20175:03 PM

## 2016-09-30 LAB — CBC
HEMATOCRIT: 32.4 % — AB (ref 39.0–52.0)
Hemoglobin: 9.7 g/dL — ABNORMAL LOW (ref 13.0–17.0)
MCH: 30.1 pg (ref 26.0–34.0)
MCHC: 29.9 g/dL — ABNORMAL LOW (ref 30.0–36.0)
MCV: 100.6 fL — AB (ref 78.0–100.0)
Platelets: 199 10*3/uL (ref 150–400)
RBC: 3.22 MIL/uL — AB (ref 4.22–5.81)
RDW: 16.7 % — ABNORMAL HIGH (ref 11.5–15.5)
WBC: 11 10*3/uL — AB (ref 4.0–10.5)

## 2016-09-30 LAB — RENAL FUNCTION PANEL
ANION GAP: 13 (ref 5–15)
Albumin: 2.6 g/dL — ABNORMAL LOW (ref 3.5–5.0)
BUN: 51 mg/dL — ABNORMAL HIGH (ref 6–20)
CHLORIDE: 98 mmol/L — AB (ref 101–111)
CO2: 26 mmol/L (ref 22–32)
CREATININE: 9.81 mg/dL — AB (ref 0.61–1.24)
Calcium: 8.9 mg/dL (ref 8.9–10.3)
GFR, EST AFRICAN AMERICAN: 6 mL/min — AB (ref >60)
GFR, EST NON AFRICAN AMERICAN: 5 mL/min — AB (ref >60)
Glucose, Bld: 104 mg/dL — ABNORMAL HIGH (ref 65–99)
POTASSIUM: 3.8 mmol/L (ref 3.5–5.1)
Phosphorus: 2.9 mg/dL (ref 2.5–4.6)
Sodium: 137 mmol/L (ref 135–145)

## 2016-10-01 NOTE — Progress Notes (Signed)
Subjective:  Patient is doing fair. Denies any acute c/o. Able to tolerated HD without problems Minimal UF TTS Schedule He is resting comfortably in bed at the moment.    Objective:  Vital signs in last 24 hours:  Temperature  97.4, pulse 87, respirations 16, blood pressure 116/83  Physical Exam: General: No acute distress, laying in the bed, obese  HEENT Anicteric, moist mucous membranes  Neck supple  Pulm/lungs normal breathing effort, coarse  CVS/Heart No rub or gallop, S1S2  Abdomen:  Soft, nontender, nondistended, diverting colostomy  Extremities: Bilateral above-the-knee amputation  Neurologic: Alert, able to follow simple commands  Skin: Wound VAC to the sacral decubitus ulcer  Access:  Left IJ tunneled dialysis catheter       Basic Metabolic Panel:   Recent Labs Lab 09/25/16 0531 09/28/16 1001 09/30/16 0402  NA 134* 138 137  K 4.7 2.8* 3.8  CL 97* 102 98*  CO2 23 28 26   GLUCOSE 89 93 104*  BUN 27* 19 51*  CREATININE 10.37* 5.71* 9.81*  CALCIUM 9.0 8.1* 8.9  PHOS 4.2 1.8* 2.9     CBC:  Recent Labs Lab 09/25/16 0531 09/28/16 0623 09/30/16 0402  WBC 10.6* 10.9* 11.0*  HGB 10.1* 9.3* 9.7*  HCT 34.2* 30.5* 32.4*  MCV 101.5* 100.3* 100.6*  PLT 214 203 199      Microbiology:  Recent Results (from the past 720 hour(s))  Culture, blood (routine x 2)     Status: Abnormal   Collection Time: 09/04/16  2:15 PM  Result Value Ref Range Status   Specimen Description BLOOD RIGHT HAND  Final   Special Requests IN PEDIATRIC BOTTLE 5CC  Final   Culture  Setup Time   Final    IN PEDIATRIC BOTTLE GRAM POSITIVE COCCI IN CLUSTERS CRITICAL RESULT CALLED TO, READ BACK BY AND VERIFIED WITH: TO CMASEKO(RN) BY TCLEVELAND 09/05/2016 AT 6:28AM    Culture (A)  Final    STAPHYLOCOCCUS AUREUS SUSCEPTIBILITIES PERFORMED ON PREVIOUS CULTURE WITHIN THE LAST 5 DAYS.    Report Status 09/07/2016 FINAL  Final  Culture, blood (routine x 2)     Status: Abnormal    Collection Time: 09/04/16  2:25 PM  Result Value Ref Range Status   Specimen Description BLOOD RIGHT HAND  Final   Special Requests IN PEDIATRIC BOTTLE 2CC  Final   Culture  Setup Time   Final    GRAM POSITIVE COCCI IN CLUSTERS IN PEDIATRIC BOTTLE CRITICAL RESULT CALLED TO, READ BACK BY AND VERIFIED WITH: TO CMASEKO(RN) BY TCLEVELAND 09/05/2016 AT 6:28AM    Culture METHICILLIN RESISTANT STAPHYLOCOCCUS AUREUS (A)  Final   Report Status 09/07/2016 FINAL  Final   Organism ID, Bacteria METHICILLIN RESISTANT STAPHYLOCOCCUS AUREUS  Final      Susceptibility   Methicillin resistant staphylococcus aureus - MIC*    CIPROFLOXACIN >=8 RESISTANT Resistant     ERYTHROMYCIN <=0.25 SENSITIVE Sensitive     GENTAMICIN <=0.5 SENSITIVE Sensitive     OXACILLIN >=4 RESISTANT Resistant     TETRACYCLINE <=1 SENSITIVE Sensitive     VANCOMYCIN 1 SENSITIVE Sensitive     TRIMETH/SULFA <=10 SENSITIVE Sensitive     CLINDAMYCIN <=0.25 SENSITIVE Sensitive     RIFAMPIN <=0.5 SENSITIVE Sensitive     Inducible Clindamycin NEGATIVE Sensitive     * METHICILLIN RESISTANT STAPHYLOCOCCUS AUREUS  Blood Culture ID Panel (Reflexed)     Status: Abnormal   Collection Time: 09/04/16  2:25 PM  Result Value Ref Range Status   Enterococcus species  NOT DETECTED NOT DETECTED Final   Vancomycin resistance NOT DETECTED NOT DETECTED Final   Listeria monocytogenes NOT DETECTED NOT DETECTED Final   Staphylococcus species DETECTED (A) NOT DETECTED Final    Comment: CRITICAL RESULT CALLED TO, READ BACK BY AND VERIFIED WITH: TO CMASEKO(RN) BY TCLEVELAND 09/05/2016 AT 6:28AM    Staphylococcus aureus DETECTED (A) NOT DETECTED Final    Comment: CRITICAL RESULT CALLED TO, READ BACK BY AND VERIFIED WITH: TO CMASEKO(RN) BY TCLEVELAND 09/05/2016 AT 6:28AM    Methicillin resistance DETECTED (A) NOT DETECTED Final    Comment: CRITICAL RESULT CALLED TO, READ BACK BY AND VERIFIED WITH: TO CMASEKO(RN) BY TCLEVELAND 09/05/2016 AT 6:28AM     Streptococcus species NOT DETECTED NOT DETECTED Final   Streptococcus agalactiae NOT DETECTED NOT DETECTED Final   Streptococcus pneumoniae NOT DETECTED NOT DETECTED Final   Streptococcus pyogenes NOT DETECTED NOT DETECTED Final   Acinetobacter baumannii NOT DETECTED NOT DETECTED Final   Enterobacteriaceae species NOT DETECTED NOT DETECTED Final   Enterobacter cloacae complex NOT DETECTED NOT DETECTED Final   Escherichia coli NOT DETECTED NOT DETECTED Final   Klebsiella oxytoca NOT DETECTED NOT DETECTED Final   Klebsiella pneumoniae NOT DETECTED NOT DETECTED Final   Proteus species NOT DETECTED NOT DETECTED Final   Serratia marcescens NOT DETECTED NOT DETECTED Final   Carbapenem resistance NOT DETECTED NOT DETECTED Final   Haemophilus influenzae NOT DETECTED NOT DETECTED Final   Neisseria meningitidis NOT DETECTED NOT DETECTED Final   Pseudomonas aeruginosa NOT DETECTED NOT DETECTED Final   Candida albicans NOT DETECTED NOT DETECTED Final   Candida glabrata NOT DETECTED NOT DETECTED Final   Candida krusei NOT DETECTED NOT DETECTED Final   Candida parapsilosis NOT DETECTED NOT DETECTED Final   Candida tropicalis NOT DETECTED NOT DETECTED Final  Culture, blood (routine x 2)     Status: Abnormal   Collection Time: 09/07/16  3:26 PM  Result Value Ref Range Status   Specimen Description BLOOD RIGHT ARM  Final   Special Requests IN PEDIATRIC BOTTLE 2CC  Final   Culture  Setup Time   Final    GRAM POSITIVE COCCI IN CLUSTERS IN PEDIATRIC BOTTLE CRITICAL RESULT CALLED TO, READ BACK BY AND VERIFIED WITH: S. JEFFERSD, NURSE AT 0742 ON [id number] BY Lucienne CapersS. YARBROUGH    Culture METHICILLIN RESISTANT STAPHYLOCOCCUS AUREUS (A)  Final   Report Status 09/10/2016 FINAL  Final   Organism ID, Bacteria METHICILLIN RESISTANT STAPHYLOCOCCUS AUREUS  Final      Susceptibility   Methicillin resistant staphylococcus aureus - MIC*    CIPROFLOXACIN >=8 RESISTANT Resistant     ERYTHROMYCIN <=0.25 SENSITIVE Sensitive      GENTAMICIN <=0.5 SENSITIVE Sensitive     OXACILLIN >=4 RESISTANT Resistant     TETRACYCLINE <=1 SENSITIVE Sensitive     VANCOMYCIN 1 SENSITIVE Sensitive     TRIMETH/SULFA <=10 SENSITIVE Sensitive     CLINDAMYCIN <=0.25 SENSITIVE Sensitive     RIFAMPIN <=0.5 SENSITIVE Sensitive     Inducible Clindamycin NEGATIVE Sensitive     * METHICILLIN RESISTANT STAPHYLOCOCCUS AUREUS  Culture, blood (routine x 2)     Status: Abnormal   Collection Time: 09/07/16  3:33 PM  Result Value Ref Range Status   Specimen Description BLOOD RIGHT HAND  Final   Special Requests IN PEDIATRIC BOTTLE 2CC  Final   Culture  Setup Time   Final    GRAM POSITIVE COCCI IN CLUSTERS IN PEDIATRIC BOTTLE CRITICAL RESULT CALLED TO, READ BACK BY AND VERIFIED WITH:  S. JEFFERS, NURSE AT 0742 ON [id number] BY Lucienne CapersS. YARBROUGH    Culture (A)  Final    STAPHYLOCOCCUS AUREUS SUSCEPTIBILITIES PERFORMED ON PREVIOUS CULTURE WITHIN THE LAST 5 DAYS.    Report Status 09/10/2016 FINAL  Final  Culture, blood (routine x 2)     Status: None   Collection Time: 09/10/16  3:30 PM  Result Value Ref Range Status   Specimen Description BLOOD RIGHT ARM  Final   Special Requests IN PEDIATRIC BOTTLE 1CC  Final   Culture NO GROWTH 5 DAYS  Final   Report Status 09/15/2016 FINAL  Final  Culture, blood (routine x 2)     Status: Abnormal   Collection Time: 09/10/16  3:32 PM  Result Value Ref Range Status   Specimen Description BLOOD RIGHT ARM  Final   Special Requests IN PEDIATRIC BOTTLE .5CC  Final   Culture  Setup Time   Final    GRAM POSITIVE COCCI IN CLUSTERS IN PEDIATRIC BOTTLE CRITICAL RESULT CALLED TO, READ BACK BY AND VERIFIED WITH: M. PERRY, NURSE AT 1306 ON [id number] BY Lucienne CapersS. YARBROUGH    Culture (A)  Final    STAPHYLOCOCCUS AUREUS SUSCEPTIBILITIES PERFORMED ON PREVIOUS CULTURE WITHIN THE LAST 5 DAYS.    Report Status 09/12/2016 FINAL  Final  Culture, blood (routine x 2)     Status: None   Collection Time: 09/14/16 12:01 PM  Result Value  Ref Range Status   Specimen Description BLOOD RIGHT HAND  Final   Special Requests IN PEDIATRIC BOTTLE 2CC  Final   Culture NO GROWTH 5 DAYS  Final   Report Status 09/19/2016 FINAL  Final  Culture, blood (routine x 2)     Status: None   Collection Time: 09/14/16 12:07 PM  Result Value Ref Range Status   Specimen Description BLOOD LEFT HAND  Final   Special Requests IN PEDIATRIC BOTTLE 2CC  Final   Culture NO GROWTH 5 DAYS  Final   Report Status 09/19/2016 FINAL  Final    Coagulation Studies: No results for input(s): LABPROT, INR in the last 72 hours.  Urinalysis: No results for input(s): COLORURINE, LABSPEC, PHURINE, GLUCOSEU, HGBUR, BILIRUBINUR, KETONESUR, PROTEINUR, UROBILINOGEN, NITRITE, LEUKOCYTESUR in the last 72 hours.  Invalid input(s): APPERANCEUR    Imaging: No results found.   Medications:    Aranesp 100 Midodrine 10 BID Phoslo 667 tid sensipar 30   Assessment/ Plan:  65 y.o. male  with a PMHx of ESRD on dialysis for 9 years, hypertension, coronary artery disease, previous CVA, severe peripheral vascular disease, bilateral above-the-knee amputations, who was admitted to [facility] on 08/26/2016 for ongoing management of a very severe large sacral decubitus ulcer.   1.  ESRD on HD:    -  continue TTS Schedule  - vascular surgery evaluation in progress for permanent access - Limited options due to high risk of infection from decubitus  2.  Anemia chronic kidney disease. Hemoglobin currently 9.7. Continue to monitor and continue Aranesp.  3.  Secondary hyperparathyroidism.  Most recent phosphorus was 1.8. sensipar,  calcium acetate  4. Large sacral decubitus ulcer.  patient will need to be able to sit for approximately 5-6 hours to be able to be transitioned to outpatient dialysis. Accepted into his regular dialysis unit as stretcher patient  5.  Hypotension:  -Continue midodrine.        LOS: 0 Tonji Elliff 12/8/20179:13 AM

## 2016-10-02 LAB — CBC
HCT: 32.6 % — ABNORMAL LOW (ref 39.0–52.0)
HEMOGLOBIN: 10 g/dL — AB (ref 13.0–17.0)
MCH: 30.4 pg (ref 26.0–34.0)
MCHC: 30.7 g/dL (ref 30.0–36.0)
MCV: 99.1 fL (ref 78.0–100.0)
PLATELETS: 203 10*3/uL (ref 150–400)
RBC: 3.29 MIL/uL — AB (ref 4.22–5.81)
RDW: 16.4 % — ABNORMAL HIGH (ref 11.5–15.5)
WBC: 10.7 10*3/uL — ABNORMAL HIGH (ref 4.0–10.5)

## 2016-10-02 LAB — RENAL FUNCTION PANEL
ALBUMIN: 2.6 g/dL — AB (ref 3.5–5.0)
ANION GAP: 11 (ref 5–15)
BUN: 29 mg/dL — ABNORMAL HIGH (ref 6–20)
CALCIUM: 9.2 mg/dL (ref 8.9–10.3)
CO2: 25 mmol/L (ref 22–32)
CREATININE: 7.99 mg/dL — AB (ref 0.61–1.24)
Chloride: 96 mmol/L — ABNORMAL LOW (ref 101–111)
GFR calc non Af Amer: 6 mL/min — ABNORMAL LOW (ref >60)
GFR, EST AFRICAN AMERICAN: 7 mL/min — AB (ref >60)
GLUCOSE: 87 mg/dL (ref 65–99)
PHOSPHORUS: 3 mg/dL (ref 2.5–4.6)
Potassium: 4 mmol/L (ref 3.5–5.1)
SODIUM: 132 mmol/L — AB (ref 135–145)

## 2016-10-04 LAB — HEPATIC FUNCTION PANEL
ALBUMIN: 2.7 g/dL — AB (ref 3.5–5.0)
ALK PHOS: 98 U/L (ref 38–126)
ALT: 9 U/L — ABNORMAL LOW (ref 17–63)
AST: 21 U/L (ref 15–41)
Bilirubin, Direct: <0.1 mg/dL — ABNORMAL LOW (ref 0.1–0.5)
TOTAL PROTEIN: 8.3 g/dL — AB (ref 6.5–8.1)
Total Bilirubin: 0.5 mg/dL (ref 0.3–1.2)

## 2016-10-04 LAB — BASIC METABOLIC PANEL
Anion gap: 12 (ref 5–15)
BUN: 39 mg/dL — AB (ref 6–20)
CHLORIDE: 99 mmol/L — AB (ref 101–111)
CO2: 26 mmol/L (ref 22–32)
CREATININE: 9.07 mg/dL — AB (ref 0.61–1.24)
Calcium: 9.6 mg/dL (ref 8.9–10.3)
GFR, EST AFRICAN AMERICAN: 6 mL/min — AB (ref >60)
GFR, EST NON AFRICAN AMERICAN: 5 mL/min — AB (ref >60)
Glucose, Bld: 94 mg/dL (ref 65–99)
POTASSIUM: 3.9 mmol/L (ref 3.5–5.1)
SODIUM: 137 mmol/L (ref 135–145)

## 2016-10-04 LAB — PROTIME-INR
INR: 1.01
Prothrombin Time: 13.3 seconds (ref 11.4–15.2)

## 2016-10-04 LAB — CBC
HEMATOCRIT: 32.6 % — AB (ref 39.0–52.0)
Hemoglobin: 10 g/dL — ABNORMAL LOW (ref 13.0–17.0)
MCH: 30.6 pg (ref 26.0–34.0)
MCHC: 30.7 g/dL (ref 30.0–36.0)
MCV: 99.7 fL (ref 78.0–100.0)
PLATELETS: 293 10*3/uL (ref 150–400)
RBC: 3.27 MIL/uL — AB (ref 4.22–5.81)
RDW: 16.3 % — AB (ref 11.5–15.5)
WBC: 12.8 10*3/uL — AB (ref 4.0–10.5)

## 2016-10-04 NOTE — Progress Notes (Signed)
Subjective:  [place] will likely be discharging tomorrow.    Objective:  Vital signs in last 24 hours:  Temperature 97.9 pulse 70 respirations 15 blood pressure 119/64  Physical Exam: General: No acute distress, laying in the bed, obese  HEENT Anicteric, moist mucous membranes  Neck supple  Pulm/lungs normal breathing effort, coarse  CVS/Heart No rub or gallop, S1S2  Abdomen:  Soft, nontender, nondistended, diverting colostomy  Extremities: Bilateral above-the-knee amputation  Neurologic: Alert, able to follow simple commands  Skin: Wound VAC to the sacral decubitus ulcer  Access:  Left IJ tunneled dialysis catheter       Basic Metabolic Panel:   Recent Labs Lab 09/28/16 1001 09/30/16 0402 10/02/16 0635 10/04/16 0735  NA 138 137 132* 137  K 2.8* 3.8 4.0 3.9  CL 102 98* 96* 99*  CO2 28 26 25 26   GLUCOSE 93 104* 87 94  BUN 19 51* 29* 39*  CREATININE 5.71* 9.81* 7.99* 9.07*  CALCIUM 8.1* 8.9 9.2 9.6  PHOS 1.8* 2.9 3.0  --      CBC:  Recent Labs Lab 09/28/16 0623 09/30/16 0402 10/02/16 0635 10/04/16 0735  WBC 10.9* 11.0* 10.7* 12.8*  HGB 9.3* 9.7* 10.0* 10.0*  HCT 30.5* 32.4* 32.6* 32.6*  MCV 100.3* 100.6* 99.1 99.7  PLT 203 199 203 293      Microbiology:  Recent Results (from the past 720 hour(s))  Culture, blood (routine x 2)     Status: Abnormal   Collection Time: 09/07/16  3:26 PM  Result Value Ref Range Status   Specimen Description BLOOD RIGHT ARM  Final   Special Requests IN PEDIATRIC BOTTLE 2CC  Final   Culture  Setup Time   Final    GRAM POSITIVE COCCI IN CLUSTERS IN PEDIATRIC BOTTLE CRITICAL RESULT CALLED TO, READ BACK BY AND VERIFIED WITH: S. JEFFERSD, NURSE AT 0742 ON [id number] BY Lucienne CapersS. YARBROUGH    Culture METHICILLIN RESISTANT STAPHYLOCOCCUS AUREUS (A)  Final   Report Status 09/10/2016 FINAL  Final   Organism ID, Bacteria METHICILLIN RESISTANT STAPHYLOCOCCUS AUREUS  Final   Susceptibility   Methicillin resistant staphylococcus aureus - MIC*    CIPROFLOXACIN >=8 RESISTANT Resistant     ERYTHROMYCIN <=0.25 SENSITIVE Sensitive     GENTAMICIN <=0.5 SENSITIVE Sensitive     OXACILLIN >=4 RESISTANT Resistant     TETRACYCLINE <=1 SENSITIVE Sensitive     VANCOMYCIN 1 SENSITIVE Sensitive     TRIMETH/SULFA <=10 SENSITIVE Sensitive     CLINDAMYCIN <=0.25 SENSITIVE Sensitive     RIFAMPIN <=0.5 SENSITIVE Sensitive     Inducible Clindamycin NEGATIVE Sensitive     * METHICILLIN RESISTANT STAPHYLOCOCCUS AUREUS  Culture, blood (routine x 2)     Status: Abnormal   Collection Time: 09/07/16  3:33 PM  Result Value Ref Range Status   Specimen Description BLOOD RIGHT HAND  Final   Special Requests IN PEDIATRIC BOTTLE 2CC  Final   Culture  Setup Time   Final    GRAM POSITIVE COCCI IN CLUSTERS IN PEDIATRIC BOTTLE CRITICAL RESULT CALLED TO, READ BACK BY AND VERIFIED WITH: S. JEFFERS, NURSE AT 0742 ON [id number] BY Lucienne CapersS. YARBROUGH    Culture (A)  Final    STAPHYLOCOCCUS AUREUS SUSCEPTIBILITIES PERFORMED ON PREVIOUS CULTURE WITHIN THE LAST 5 DAYS.    Report Status 09/10/2016 FINAL  Final  Culture, blood (routine x 2)     Status: None   Collection Time: 09/10/16  3:30 PM  Result Value Ref Range Status   Specimen Description BLOOD RIGHT ARM  Final   Special Requests IN PEDIATRIC BOTTLE 1CC  Final   Culture NO GROWTH 5 DAYS  Final   Report Status 09/15/2016 FINAL  Final  Culture, blood (routine x 2)     Status: Abnormal   Collection Time: 09/10/16  3:32 PM  Result Value Ref Range Status   Specimen Description BLOOD RIGHT ARM  Final   Special Requests IN PEDIATRIC BOTTLE .5CC  Final   Culture  Setup Time   Final    GRAM POSITIVE COCCI IN CLUSTERS IN PEDIATRIC BOTTLE CRITICAL RESULT CALLED TO, READ BACK BY AND VERIFIED WITH: M. PERRY, NURSE AT 1306 ON [id number] BY Lucienne CapersS. YARBROUGH    Culture (A)  Final    STAPHYLOCOCCUS AUREUS SUSCEPTIBILITIES PERFORMED ON PREVIOUS CULTURE WITHIN THE  LAST 5 DAYS.    Report Status 09/12/2016 FINAL  Final  Culture, blood (routine x 2)     Status: None   Collection Time: 09/14/16 12:01 PM  Result Value Ref Range Status   Specimen Description BLOOD RIGHT HAND  Final   Special Requests IN PEDIATRIC BOTTLE 2CC  Final   Culture NO GROWTH 5 DAYS  Final   Report Status 09/19/2016 FINAL  Final  Culture, blood (routine x 2)     Status: None   Collection Time: 09/14/16 12:07 PM  Result Value Ref Range Status   Specimen Description BLOOD LEFT HAND  Final   Special Requests IN PEDIATRIC BOTTLE 2CC  Final   Culture NO GROWTH 5 DAYS  Final   Report Status 09/19/2016 FINAL  Final    Coagulation Studies:  Recent Labs  10/04/16 0735  LABPROT 13.3  INR 1.01    Urinalysis: No results for input(s): COLORURINE, LABSPEC, PHURINE, GLUCOSEU, HGBUR, BILIRUBINUR, KETONESUR, PROTEINUR, UROBILINOGEN, NITRITE, LEUKOCYTESUR in the last 72 hours.  Invalid input(s): APPERANCEUR    Imaging: No results found.   Medications:    Aranesp 100 Midodrine 10 BID Phoslo 667 tid sensipar 30   Assessment/ Plan:  65 y.o. male  with a PMHx of ESRD on dialysis for 9 years, hypertension, coronary artery disease, previous CVA, severe peripheral vascular disease, bilateral above-the-knee amputations, who was admitted to [facility] on 08/26/2016 for ongoing management of a very severe large sacral decubitus ulcer.   1.  ESRD on HD:    -  Patient had hemodialysis on Saturday. No acute indication for dialysis today. We will plan for dialysis again tomorrow prior to discharge.  2.  Anemia chronic kidney disease. Hemoglobin 10.0 and a target. Continue to monitor CBC as an outpatient.  3.  Secondary hyperparathyroidism.  Most recent phosphorus was 3.0. Continue to monitor her bone mineral metabolism parameters as an outpatient. Continue calcium acetate.  4. Large sacral decubitus ulcer.  patient will need to be able to sit for approximately 5-6 hours to be able to be  transitioned to outpatient dialysis. Accepted into his regular dialysis unit as stretcher patient  5.  Hypotension:  -Continue midodrine.        LOS: 0 Ezzard Ditmer 12/11/20174:31 PM

## 2016-10-05 LAB — CBC
HCT: 37.4 % — ABNORMAL LOW (ref 39.0–52.0)
Hemoglobin: 11.4 g/dL — ABNORMAL LOW (ref 13.0–17.0)
MCH: 30.4 pg (ref 26.0–34.0)
MCHC: 30.5 g/dL (ref 30.0–36.0)
MCV: 99.7 fL (ref 78.0–100.0)
PLATELETS: 292 10*3/uL (ref 150–400)
RBC: 3.75 MIL/uL — AB (ref 4.22–5.81)
RDW: 16.2 % — ABNORMAL HIGH (ref 11.5–15.5)
WBC: 14.7 10*3/uL — AB (ref 4.0–10.5)

## 2016-10-05 LAB — RENAL FUNCTION PANEL
Albumin: 2.9 g/dL — ABNORMAL LOW (ref 3.5–5.0)
Anion gap: 16 — ABNORMAL HIGH (ref 5–15)
BUN: 51 mg/dL — ABNORMAL HIGH (ref 6–20)
CALCIUM: 10 mg/dL (ref 8.9–10.3)
CO2: 21 mmol/L — ABNORMAL LOW (ref 22–32)
CREATININE: 10.68 mg/dL — AB (ref 0.61–1.24)
Chloride: 100 mmol/L — ABNORMAL LOW (ref 101–111)
GFR, EST AFRICAN AMERICAN: 5 mL/min — AB (ref >60)
GFR, EST NON AFRICAN AMERICAN: 4 mL/min — AB (ref >60)
Glucose, Bld: 98 mg/dL (ref 65–99)
PHOSPHORUS: 2.9 mg/dL (ref 2.5–4.6)
Potassium: 4.1 mmol/L (ref 3.5–5.1)
SODIUM: 137 mmol/L (ref 135–145)

## 2018-10-13 IMAGING — CR DG CHEST 1V PORT
1 series · 1 of 1 positions shown · non-contrast
Comparison: None.

CLINICAL DATA: Acute onset of respiratory failure. Initial
encounter.

EXAM:
PORTABLE CHEST 1 VIEW

[AP]
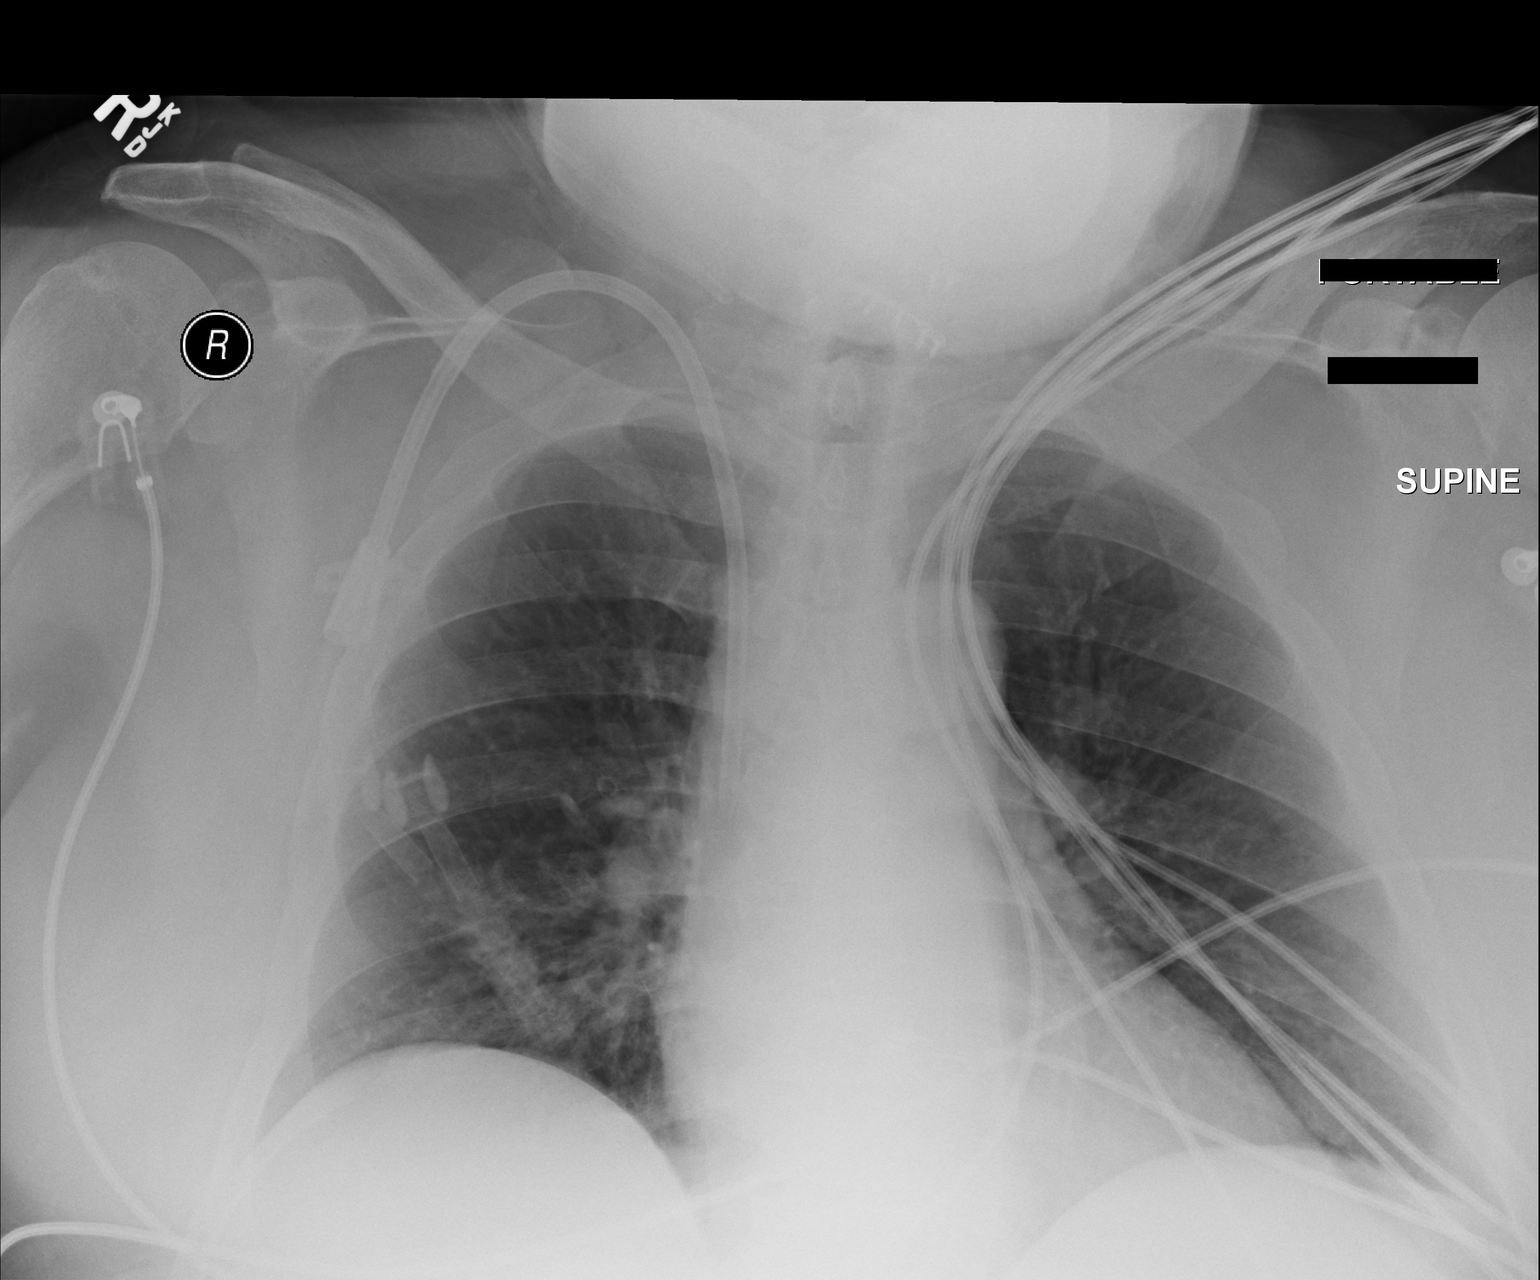

[1 of 1 positions shown; findings below may reference images not displayed]

FINDINGS: The lungs are well-aerated. Mild vascular congestion is noted. Mild
right basilar opacity may reflect atelectasis or possibly mild
pneumonia. There is no evidence of pleural effusion or pneumothorax.

The cardiomediastinal silhouette is within normal limits. No acute
osseous abnormalities are seen. A right-sided dual-lumen catheter is
noted ending about the mid SVC.
IMPRESSION: Mild vascular congestion noted. Mild right basilar opacity may
reflect atelectasis or possibly mild pneumonia.

## 2018-11-09 IMAGING — US IR FLUORO GUIDE CV LINE*R*
1 series · 1 of 1 positions shown · non-contrast
Comparison: none

INDICATION: 65-year-old male in need of hemodialysis. A temporary hemodialysis
catheter will be placed.

[Series 1: ir rad eval and mgt. · 1 of 1 slices shown]
[im 1/1]
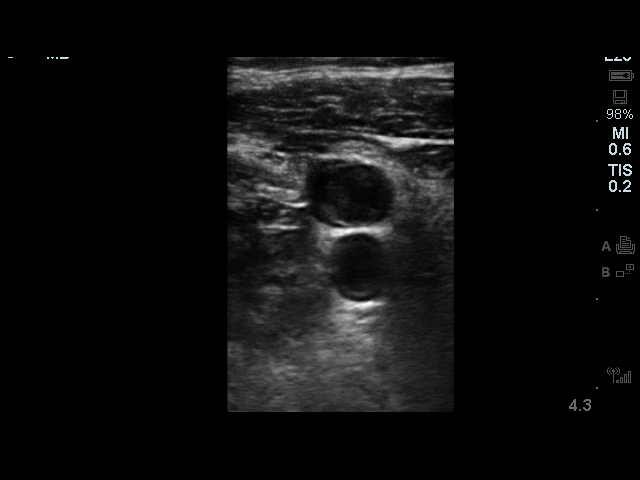

[1 of 1 positions shown; findings below may reference images not displayed]

EXAM:
IR RIGHT FLOURO GUIDE CV LINE; IR ULTRASOUND GUIDANCE VASC ACCESS
RIGHT

MEDICATIONS:
None required

ANESTHESIA/SEDATION:
None

FLUOROSCOPY TIME:  Fluoroscopy Time: 1 minutes 30 seconds (8.1 mGy).

COMPLICATIONS:
None immediate.

PROCEDURE:
Informed written consent was obtained from the patient after a
thorough discussion of the procedural risks, benefits and
alternatives. All questions were addressed. Maximal Sterile Barrier
Technique was utilized including caps, mask, sterile gowns, sterile
gloves, sterile drape, hand hygiene and skin antiseptic. A timeout
was performed prior to the initiation of the procedure.

The right internal jugular vein was interrogated with ultrasound.
Internal jugular vein demonstrates a thick wall and nonocclusive
thrombus.

Local anesthesia was attained by infiltration with 1% lidocaine. A
small dermatotomy was made. Under real-time sonographic guidance,
the right internal jugular vein was punctured with a 21 gauge
micropuncture needle. An image was obtained and stored for the
medical record. A night tracks wire was then advanced into the right
heart. The micro wire is exchange for a reinforced 5 French
transitional micro sheath. The micro wire was then exchanged for an
Amplatz wire. The skin tract was dilated and a Mahurkar non tunneled
hemodialysis catheter with additional central venous catheter port
was advanced over the wire and positioned with the catheter tips at
the superior cavoatrial junction.

The catheter flushes and aspirates with ease. It was flushed with
saline and then heparinized saline and secured to the skin with 0
Prolene suture. Sterile bandages were applied.
IMPRESSION: Successful placement of a Mahurkar non tunneled temporary
hemodialysis catheter with additional central venous catheter port.
The catheter tips are at the superior cavoatrial junction and ready
for immediate use.

## 2022-05-25 DEATH — deceased
